# Patient Record
Sex: Male | Born: 1990
Health system: Southern US, Community
[De-identification: ages and names within clinical notes are randomized; demographics above are authoritative.]

## PROBLEM LIST (undated history)

## (undated) DIAGNOSIS — F4310 Post-traumatic stress disorder, unspecified: Secondary | ICD-10-CM

## (undated) DIAGNOSIS — F329 Major depressive disorder, single episode, unspecified: Secondary | ICD-10-CM

## (undated) DIAGNOSIS — I7300 Raynaud's syndrome without gangrene: Secondary | ICD-10-CM

## (undated) DIAGNOSIS — G4730 Sleep apnea, unspecified: Secondary | ICD-10-CM

## (undated) DIAGNOSIS — J45909 Unspecified asthma, uncomplicated: Secondary | ICD-10-CM

## (undated) DIAGNOSIS — E785 Hyperlipidemia, unspecified: Secondary | ICD-10-CM

## (undated) DIAGNOSIS — F419 Anxiety disorder, unspecified: Secondary | ICD-10-CM

## (undated) DIAGNOSIS — F32A Depression, unspecified: Secondary | ICD-10-CM

## (undated) HISTORY — DX: Hyperlipidemia, unspecified: E78.5

## (undated) HISTORY — DX: Raynaud's syndrome without gangrene: I73.00

---
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
## 2009-04-26 ENCOUNTER — Emergency Department (HOSPITAL_COMMUNITY): Admission: EM | Admit: 2009-04-26 | Discharge: 2009-04-26 | Payer: Self-pay | Admitting: Emergency Medicine

## 2010-05-09 ENCOUNTER — Emergency Department (HOSPITAL_COMMUNITY): Payer: Self-pay

## 2010-05-09 ENCOUNTER — Emergency Department (HOSPITAL_COMMUNITY)
Admission: EM | Admit: 2010-05-09 | Discharge: 2010-05-09 | Disposition: A | Discharge status: Home / Self Care | Payer: Self-pay | Attending: Emergency Medicine | Admitting: Emergency Medicine

## 2010-05-09 DIAGNOSIS — S92109A Unspecified fracture of unspecified talus, initial encounter for closed fracture: Secondary | ICD-10-CM | POA: Insufficient documentation

## 2010-05-09 DIAGNOSIS — X500XXA Overexertion from strenuous movement or load, initial encounter: Secondary | ICD-10-CM | POA: Insufficient documentation

## 2010-07-04 ENCOUNTER — Emergency Department (HOSPITAL_COMMUNITY)
Admission: EM | Admit: 2010-07-04 | Discharge: 2010-07-04 | Discharge status: Against Medical Advice | Payer: Self-pay | Attending: Emergency Medicine | Admitting: Emergency Medicine

## 2010-07-04 DIAGNOSIS — R51 Headache: Secondary | ICD-10-CM | POA: Insufficient documentation

## 2010-07-04 DIAGNOSIS — R112 Nausea with vomiting, unspecified: Secondary | ICD-10-CM | POA: Insufficient documentation

## 2010-09-27 ENCOUNTER — Emergency Department (HOSPITAL_COMMUNITY): Payer: Self-pay

## 2010-09-27 ENCOUNTER — Other Ambulatory Visit: Payer: Self-pay

## 2010-09-27 ENCOUNTER — Encounter: Payer: Self-pay | Admitting: Emergency Medicine

## 2010-09-27 ENCOUNTER — Emergency Department (HOSPITAL_COMMUNITY)
Admission: EM | Admit: 2010-09-27 | Discharge: 2010-09-27 | Disposition: A | Discharge status: Home / Self Care | Payer: Self-pay | Attending: Emergency Medicine | Admitting: Emergency Medicine

## 2010-09-27 DIAGNOSIS — R4182 Altered mental status, unspecified: Secondary | ICD-10-CM | POA: Insufficient documentation

## 2010-09-27 DIAGNOSIS — F17200 Nicotine dependence, unspecified, uncomplicated: Secondary | ICD-10-CM | POA: Insufficient documentation

## 2010-09-27 DIAGNOSIS — J45909 Unspecified asthma, uncomplicated: Secondary | ICD-10-CM | POA: Insufficient documentation

## 2010-09-27 LAB — DIFFERENTIAL
Basophils Absolute: 0 10*3/uL (ref 0.0–0.1)
Basophils Relative: 0 % (ref 0–1)
Lymphocytes Relative: 47 % — ABNORMAL HIGH (ref 12–46)
Monocytes Relative: 9 % (ref 3–12)
Neutrophils Relative %: 40 % — ABNORMAL LOW (ref 43–77)

## 2010-09-27 LAB — BASIC METABOLIC PANEL
BUN: 11 mg/dL (ref 6–23)
GFR calc Af Amer: >60 mL/min (ref >60)
GFR calc non Af Amer: >60 mL/min (ref >60)
Glucose, Bld: 141 mg/dL — ABNORMAL HIGH (ref 70–99)
Sodium: 137 mEq/L (ref 135–145)

## 2010-09-27 LAB — CBC
HCT: 47 % (ref 39.0–52.0)
Hemoglobin: 16.2 g/dL (ref 13.0–17.0)
MCH: 31.7 pg (ref 26.0–34.0)
MCV: 92 fL (ref 78.0–100.0)
Platelets: 238 10*3/uL (ref 150–400)
RBC: 5.11 MIL/uL (ref 4.22–5.81)
RDW: 12.6 % (ref 11.5–15.5)
WBC: 4.7 10*3/uL (ref 4.0–10.5)

## 2010-09-27 LAB — BLOOD GAS, ARTERIAL
Acid-base deficit: 1.1 mmol/L (ref 0.0–2.0)
Bicarbonate: 22 mEq/L (ref 20.0–24.0)
O2 Saturation: 98 %
pCO2 arterial: 30 mmHg — ABNORMAL LOW (ref 35.0–45.0)

## 2010-09-27 LAB — LACTIC ACID, PLASMA: Lactic Acid, Venous: 3.6 mmol/L — ABNORMAL HIGH (ref 0.5–2.2)

## 2010-09-27 MED ORDER — HYDROMORPHONE HCL 1 MG/ML IJ SOLN
1.0000 mg | Freq: Once | INTRAMUSCULAR | Status: AC
  Administered 2010-09-27: 1 mg via INTRAVENOUS
  Filled 2010-09-27: qty 1

## 2010-09-27 MED ORDER — IPRATROPIUM BROMIDE 0.02 % IN SOLN: 0.5000 mg | Freq: Once | RESPIRATORY_TRACT | Status: DC

## 2010-09-27 MED ORDER — LORAZEPAM 2 MG/ML IJ SOLN
1.0000 mg | Freq: Once | INTRAMUSCULAR | Status: AC
  Administered 2010-09-27: 1 mg via INTRAVENOUS
  Filled 2010-09-27: qty 1

## 2010-09-27 MED ORDER — SODIUM CHLORIDE 0.9 % IV SOLN
Freq: Once | INTRAVENOUS | Status: AC
  Administered 2010-09-27: 18:00:00 via INTRAVENOUS

## 2010-09-27 MED ORDER — IPRATROPIUM BROMIDE HFA 17 MCG/ACT IN AERS: 2.0000 | INHALATION_SPRAY | Freq: Once | RESPIRATORY_TRACT | Status: DC

## 2010-09-27 MED ORDER — IOHEXOL 350 MG/ML SOLN
100.0000 mL | Freq: Once | INTRAVENOUS | Status: AC | PRN
  Administered 2010-09-27: 100 mL via INTRAVENOUS

## 2010-09-27 MED ORDER — IPRATROPIUM BROMIDE 0.02 % IN SOLN
0.5000 mg | Freq: Once | RESPIRATORY_TRACT | Status: AC
  Administered 2010-09-27: 0.5 mg via RESPIRATORY_TRACT
  Filled 2010-09-27: qty 2.5

## 2010-09-27 MED ORDER — ALBUTEROL SULFATE HFA 108 (90 BASE) MCG/ACT IN AERS: 2.0000 | INHALATION_SPRAY | RESPIRATORY_TRACT | Status: DC | PRN

## 2010-09-27 MED ORDER — METHYLPREDNISOLONE SODIUM SUCC 125 MG IJ SOLR
125.0000 mg | Freq: Once | INTRAMUSCULAR | Status: AC
  Administered 2010-09-27: 125 mg via INTRAVENOUS
  Filled 2010-09-27: qty 2

## 2010-09-27 MED ORDER — ALBUTEROL SULFATE (5 MG/ML) 0.5% IN NEBU
2.5000 mg | INHALATION_SOLUTION | Freq: Once | RESPIRATORY_TRACT | Status: AC
  Administered 2010-09-27: 2.5 mg via RESPIRATORY_TRACT
  Filled 2010-09-27: qty 0.5

## 2010-09-27 MED ORDER — SODIUM CHLORIDE 0.9 % IV BOLUS (SEPSIS)
1000.0000 mL | Freq: Once | INTRAVENOUS | Status: AC
  Administered 2010-09-27: 1000 mL via INTRAVENOUS

## 2010-09-27 MED ORDER — NALOXONE HCL 0.4 MG/ML IJ SOLN
0.4000 mg | INTRAMUSCULAR | Status: DC | PRN
  Administered 2010-09-27: 0.4 mg via INTRAVENOUS

## 2010-09-27 MED ORDER — PREDNISONE 50 MG PO TABS: 50.0000 mg | ORAL_TABLET | Freq: Every day | ORAL | Status: AC

## 2010-09-27 MED ORDER — NALOXONE HCL 0.4 MG/ML IJ SOLN
INTRAMUSCULAR | Status: AC
  Filled 2010-09-27: qty 1

## 2010-09-27 MED ORDER — MAGNESIUM SULFATE 40 MG/ML IJ SOLN
2.0000 g | Freq: Once | INTRAMUSCULAR | Status: AC
  Administered 2010-09-27: 2 g via INTRAVENOUS
  Filled 2010-09-27: qty 50

## 2010-09-27 NOTE — ED Notes (Signed)
Patient having moments of apnea. Will respond and inhale when sternal rub and name called.  3 episodes in in last 15 minute.

## 2010-09-27 NOTE — ED Notes (Signed)
Patient had another episode of apnea. Required sternal rub to get a response.  Patient inhaled deeply and seems alert after.

## 2010-09-27 NOTE — ED Notes (Signed)
Pt self ambulated out with a steady gait ststing no needs

## 2010-09-27 NOTE — ED Notes (Signed)
Dr. Manus Gunning made aware of apneic episode of approximately 15 seconds. Order for narcan received and given. Patient is talking, and is oriented. Remains drowsy.

## 2010-09-27 NOTE — ED Notes (Signed)
Patient arrives via private car. Per girlfriend patient started complaining of pain 20 minutes ago. Patient was unable to tell her where he was hurting. Patient arrives to room 5, responds to painful stimuli. Non verbal at this time, but will grunt and moan. Will point to chest and LUQ of abdomen when asked where he hurts.   Dr. Manus Gunning to bedside to evaluate patient immediately upon arrival to ED room.  Bilateral 18 gauge IVs started by this RN.  Patient placed on continuous cardiac monitoring, continuous pulse oximetry, and NBP cycling q 15 minutes.

## 2010-09-27 NOTE — ED Notes (Signed)
Patient states that it is getting harder to breath

## 2010-09-27 NOTE — ED Notes (Signed)
Patient much more alert.  Speaking to girlfriend and having conversation with MD.  Patient states he is feeling like will vomit.  Patient is sitting upright.

## 2010-09-27 NOTE — ED Notes (Signed)
Patient breathing is less labored.  Patient more alert and responding.

## 2010-09-27 NOTE — ED Notes (Signed)
Family at bedside. Patient remains anxious.   Dr. Manus Gunning returned to bedside to re-evaluate patient.

## 2010-09-27 NOTE — ED Notes (Signed)
Respiratory at bedside.

## 2010-09-27 NOTE — ED Notes (Signed)
Pt resting in bed in room family at bedside no noted distress no stated needs at this time pt given sips of water.

## 2010-09-27 NOTE — ED Notes (Signed)
Pt in room in bed no noted or stated needs

## 2010-09-27 NOTE — ED Provider Notes (Signed)
Scribed for Dr. Manus Gunning, the patient was seen in room 5. This chart was scribed by Hillery Hunter. This patient's care was started at 16:45, upon arrival.    History     CSN: [id number] Arrival date & time: 09/27/2010  4:41 PM  Chief Complaint  Patient presents with  . Altered Mental Status   Patient is a 20 y.o. male presenting with altered mental status. The history is provided by a friend (Girlfriend). The history is limited by the condition of the patient (Altered mental status).  Altered Mental Status This is a new problem. The current episode started less than 1 hour ago (10-15 minutes ago). The problem has not changed since onset.Associated symptoms include chest pain and shortness of breath.    Patient was brought in by ambulance mentally altered and accompanied by his girlfriend. Girlfriend states that he started grabbing his chest at about 10-20 minutes before arrival. He is responds to questions by nodding yes or no only. Girlfriend reports history of exertional chest pain aggravated by running or basketball. Complete collection of history hindered by patient's altered mental status.  History reviewed. No pertinent past medical history. Patient confirms history of asthma without previous hospitalization.  History reviewed. No pertinent past surgical history.  History reviewed. No pertinent family history.  History  Substance Use Topics  . Smoking status: Current Everyday Smoker -- 0.5 packs/day  . Smokeless tobacco: Not on file  . Alcohol Use: Yes     occasionally     Review of Systems  Unable to perform ROS Respiratory: Positive for shortness of breath.   Cardiovascular: Positive for chest pain.  Psychiatric/Behavioral: Positive for altered mental status.  girlfriend present denies vomiting,   Physical Exam  There were no vitals taken for this visit.  16:45 Vitals taken at bedside upon arrival: Pulse ox 100% on room air with BP: 126/88, Pulse:  105  Physical Exam  Nursing note and vitals reviewed. Constitutional: He appears well-developed and well-nourished.  HENT:  Head: Normocephalic and atraumatic.  Mouth/Throat: Oropharynx is clear and moist.  Eyes: Conjunctivae are normal. Pupils are equal, round, and reactive to light.  Neck: Neck supple. No tracheal deviation present.  Cardiovascular: Regular rhythm, normal heart sounds and intact distal pulses.  Tachycardia present.          Tachycardic. Patient localized pain to this region  Pulmonary/Chest: He exhibits tenderness. He exhibits no laceration, no crepitus and no deformity.       equal and bilateral breath sounds  Abdominal: Soft. He exhibits no distension. There is no tenderness.  Musculoskeletal: He exhibits no edema.  Neurological:       Responds to questions by nodding or shaking head only  Skin: He is diaphoretic.  Psychiatric:       NA    ED Course  Procedures  OTHER DATA REVIEWED: Nursing notes, vital signs reviewed   DIAGNOSTIC STUDIES: Oxygen Saturation is 100% on room air, Normal by my interpretation.     Date: 09/27/2010  Rate: 102  Rhythm: sinus tachycardia  QRS Axis: normal  Intervals: normal  ST/T Wave abnormalities: normal  Conduction Disutrbances:none  Narrative Interpretation: No STEMI  Old EKG Reviewed: none available   LABS / RADIOLOGY:  Results for orders placed during the hospital encounter of 09/27/10  CBC      Component Value Range   WBC 4.7  4.0 - 10.5 (K/uL)   RBC 5.11  4.22 - 5.81 (MIL/uL)   Hemoglobin 16.2  13.0 - 17.0 (g/dL)  HCT 47.0  39.0 - 52.0 (%)   MCV 92.0  78.0 - 100.0 (fL)   MCH 31.7  26.0 - 34.0 (pg)   MCHC 34.5  30.0 - 36.0 (g/dL)   RDW 16.1  09.6 - 04.5 (%)   Platelets 238  150 - 400 (K/uL)  DIFFERENTIAL      Component Value Range   Neutrophils Relative 40 (*) 43 - 77 (%)   Neutro Abs 1.9  1.7 - 7.7 (K/uL)   Lymphocytes Relative 47 (*) 12 - 46 (%)   Lymphs Abs 2.2  0.7 - 4.0 (K/uL)   Monocytes  Relative 9  3 - 12 (%)   Monocytes Absolute 0.4  0.1 - 1.0 (K/uL)   Eosinophils Relative 4  0 - 5 (%)   Eosinophils Absolute 0.2  0.0 - 0.7 (K/uL)   Basophils Relative 0  0 - 1 (%)   Basophils Absolute 0.0  0.0 - 0.1 (K/uL)   Dg Chest Portable 1 View  09/27/2010  *RADIOLOGY REPORT*  Clinical Data: Shortness of breath with altered mental status  PORTABLE CHEST - 1 VIEW  Comparison: 04/26/2009  Findings: Heart and mediastinal contours are within normal limits. Lung fields appear clear with no signs of focal infiltrate or congestive failure.  No pleural fluid or peribronchial cuffing is seen.  Bony structures appear intact.  IMPRESSION: Stable cardiopulmonary appearance with no new focal or acute abnormality identified.  Original Report Authenticated By: Bertha Stakes, M.D.    ED COURSE / COORDINATION OF CARE: 16:48. Ordered CBC, BMP, ABG, INR, D-dimer, lipase, lactic acid, differential, portable CXR, EKG. 17:00. Ordered Albuterol/Atrovent nebulizer, IV NS, Ativan 1mg  IVP, Dilaudid 1mg  IVP. 17:12. Rechecked patient who now has wheezing, no crepitus, non febrile with equal and bilateral lung sounds. Patient is still diaphoretic, but nods head to confirm that he feel improved after initial treatment. 17:30. Ordered repeat Albuterol/Atrovent breathing treatment, Prednisone 125mg  IV, magnesium sulfate 2grams IVPB. 18:30. Rechecked patient who is more alert and responsive and feels improved after another breathing treatment. He complains of continuing chest pain. Blood pressure is 133/76 now and he is still tachy at 106. Narcan 0.4mg  IVP administered with me at bedside. 18:35. Rechecked patient who is further improved and happy to have his girlfriend at his side. He improved some with Narcan, but appears to be even more positively affected by having his girlfriend with him. Informed patient of need for additional testing CT Brain, CT Angio chest.   MDM: Differential Diagnosis:  AMS x 10-15 minutes  per girlfriend. Very diaphoretic. Opens eyes to verbal stimuli and nods yes and no. Holds L chest when asked about pain. Grunting and moaning, short shallow breaths. BS equal. L chest wall tender.  Abdomen soft. CBG 139, EKG sinus tachy 102.   Working of breathing and mental status much improved after nebs and steroids.  Chest pain continues.  CTPE neg.  Respiratory alkalosis on ABG consistent with previous hyperventilation.   IMPRESSION: Diagnoses that have been ruled out:  Diagnoses that are still under consideration:  Final diagnoses:      PLAN:  Home Advised to return for worsening or additional problems such as abdominal or chest pain The patient is to return the emergency department if there is any worsening of symptoms. I have reviewed the discharge instructions with the patietn   CONDITION ON DISCHARGE: Stable   MEDICATIONS GIVEN IN THE E.D.  Medications  0.9 %  sodium chloride infusion (not administered)  sodium chloride 0.9 %  bolus 1,000 mL (1000 mL Intravenous Given 09/27/10 1702)  magnesium sulfate IVPB 2 g (2 g Intravenous Given 09/27/10 1729)  ipratropium (ATROVENT) 0.02 % nebulizer solution 0.5 mg (not administered)  LORazepam (ATIVAN) injection 1 mg (1 mg Intravenous Given 09/27/10 1700)  HYDROmorphone (DILAUDID) injection 1 mg (1 mg Intravenous Given 09/27/10 1701)  albuterol (PROVENTIL) (5 MG/ML) 0.5% nebulizer solution 2.5 mg (2.5 mg Nebulization Given 09/27/10 1731)  methylPREDNISolone sodium succinate (SOLU-MEDROL) injection 125 mg (125 mg Intravenous Given 09/27/10 1726)  ipratropium (ATROVENT) 0.02 % nebulizer solution 0.5 mg (0.5 mg Nebulization Given 09/27/10 1731)     DISCHARGE MEDICATIONS: New Prescriptions   No medications on file    I personally performed the services described in this documentation, which was scribed in my presence.  The recorded information has been reviewed and considered.         Glynn Octave, MD 09/28/10 [phone]

## 2010-09-27 NOTE — ED Notes (Signed)
Blood glucose 139

## 2010-09-29 LAB — GLUCOSE, CAPILLARY: Glucose-Capillary: 139 mg/dL — ABNORMAL HIGH (ref 70–99)

## 2012-08-05 ENCOUNTER — Emergency Department (HOSPITAL_COMMUNITY): Payer: Self-pay

## 2012-08-05 ENCOUNTER — Encounter (HOSPITAL_COMMUNITY): Payer: Self-pay | Admitting: Emergency Medicine

## 2012-08-05 ENCOUNTER — Emergency Department (HOSPITAL_COMMUNITY)
Admission: EM | Admit: 2012-08-05 | Discharge: 2012-08-05 | Disposition: A | Discharge status: Home / Self Care | Payer: Self-pay | Attending: Emergency Medicine | Admitting: Emergency Medicine

## 2012-08-05 DIAGNOSIS — W2209XA Striking against other stationary object, initial encounter: Secondary | ICD-10-CM | POA: Insufficient documentation

## 2012-08-05 DIAGNOSIS — S139XXA Sprain of joints and ligaments of unspecified parts of neck, initial encounter: Secondary | ICD-10-CM | POA: Insufficient documentation

## 2012-08-05 DIAGNOSIS — S060X0A Concussion without loss of consciousness, initial encounter: Secondary | ICD-10-CM | POA: Insufficient documentation

## 2012-08-05 DIAGNOSIS — S161XXA Strain of muscle, fascia and tendon at neck level, initial encounter: Secondary | ICD-10-CM

## 2012-08-05 DIAGNOSIS — R4182 Altered mental status, unspecified: Secondary | ICD-10-CM | POA: Insufficient documentation

## 2012-08-05 DIAGNOSIS — F29 Unspecified psychosis not due to a substance or known physiological condition: Secondary | ICD-10-CM | POA: Insufficient documentation

## 2012-08-05 DIAGNOSIS — IMO0002 Reserved for concepts with insufficient information to code with codable children: Secondary | ICD-10-CM | POA: Insufficient documentation

## 2012-08-05 DIAGNOSIS — R42 Dizziness and giddiness: Secondary | ICD-10-CM | POA: Insufficient documentation

## 2012-08-05 DIAGNOSIS — F17200 Nicotine dependence, unspecified, uncomplicated: Secondary | ICD-10-CM | POA: Insufficient documentation

## 2012-08-05 DIAGNOSIS — R413 Other amnesia: Secondary | ICD-10-CM | POA: Insufficient documentation

## 2012-08-05 DIAGNOSIS — Y9389 Activity, other specified: Secondary | ICD-10-CM | POA: Insufficient documentation

## 2012-08-05 DIAGNOSIS — Y9289 Other specified places as the place of occurrence of the external cause: Secondary | ICD-10-CM | POA: Insufficient documentation

## 2012-08-05 LAB — BASIC METABOLIC PANEL
BUN: 13 mg/dL (ref 6–23)
Creatinine, Ser: 1.11 mg/dL (ref 0.50–1.35)
Glucose, Bld: 84 mg/dL (ref 70–99)
Potassium: 4.3 mEq/L (ref 3.5–5.1)
Sodium: 139 mEq/L (ref 135–145)

## 2012-08-05 LAB — ETHANOL: Alcohol, Ethyl (B): <11 mg/dL (ref 0–11)

## 2012-08-05 LAB — CBC
Hemoglobin: 16.5 g/dL (ref 13.0–17.0)
MCH: 30.7 pg (ref 26.0–34.0)
MCHC: 34.4 g/dL (ref 30.0–36.0)
Platelets: 237 10*3/uL (ref 150–400)
RDW: 13.1 % (ref 11.5–15.5)

## 2012-08-05 LAB — RAPID URINE DRUG SCREEN, HOSP PERFORMED
Amphetamines: NOT DETECTED
Barbiturates: NOT DETECTED
Benzodiazepines: NOT DETECTED

## 2012-08-05 LAB — PROTIME-INR
INR: 1.16 (ref 0.00–1.49)
Prothrombin Time: 14.6 s (ref 11.6–15.2)

## 2012-08-05 MED ORDER — METHOCARBAMOL 500 MG PO TABS: 500.0000 mg | ORAL_TABLET | Freq: Two times a day (BID) | ORAL | Status: DC

## 2012-08-05 MED ORDER — METHOCARBAMOL 100 MG/ML IJ SOLN
500.0000 mg | Freq: Once | INTRAVENOUS | Status: AC
  Filled 2012-08-05: qty 5

## 2012-08-05 MED ORDER — TRAMADOL HCL 50 MG PO TABS: 50.0000 mg | ORAL_TABLET | Freq: Four times a day (QID) | ORAL | Status: DC | PRN

## 2012-08-05 MED ORDER — IBUPROFEN 600 MG PO TABS: 600.0000 mg | ORAL_TABLET | Freq: Four times a day (QID) | ORAL | Status: DC | PRN

## 2012-08-05 MED ORDER — METHOCARBAMOL 100 MG/ML IJ SOLN: 1000.0000 mg | Freq: Once | INTRAMUSCULAR | Status: DC

## 2012-08-05 MED ORDER — SODIUM CHLORIDE 0.9 % IV BOLUS (SEPSIS)
1000.0000 mL | Freq: Once | INTRAVENOUS | Status: AC
  Administered 2012-08-05: 1000 mL via INTRAVENOUS

## 2012-08-05 MED ORDER — METHOCARBAMOL 100 MG/ML IJ SOLN
500.0000 mg | Freq: Once | INTRAMUSCULAR | Status: DC
  Filled 2012-08-05 (×2): qty 5

## 2012-08-05 MED ORDER — KETOROLAC TROMETHAMINE 30 MG/ML IJ SOLN
30.0000 mg | Freq: Once | INTRAMUSCULAR | Status: AC
  Administered 2012-08-05: 30 mg via INTRAVENOUS
  Filled 2012-08-05: qty 1

## 2012-08-05 MED ORDER — MORPHINE SULFATE 4 MG/ML IJ SOLN
4.0000 mg | Freq: Once | INTRAMUSCULAR | Status: AC
  Administered 2012-08-05: 4 mg via INTRAVENOUS
  Filled 2012-08-05: qty 1

## 2012-08-05 MED ORDER — METHOCARBAMOL 100 MG/ML IJ SOLN
500.0000 mg | Freq: Once | INTRAMUSCULAR | Status: DC
  Administered 2012-08-05: 500 mg via INTRAVENOUS

## 2012-08-05 NOTE — ED Notes (Signed)
Pt ambulating independently w/ steady gait on d/c in no acute distress, A&Ox4.D/c instructions reviewed w/ pt and family - pt and family deny any further questions or concerns at present.  

## 2012-08-05 NOTE — ED Provider Notes (Signed)
History     CSN: [id number]  Arrival date & time 08/05/12  1659   First MD Initiated Contact with Patient 08/05/12 1704      Chief Complaint  Patient presents with  . Head Injury    (Consider location/radiation/quality/duration/timing/severity/associated sxs/prior treatment) HPI 22 year old M struck his head on a waterslide today at roughly 1600. Pt has had confusion, amnesia to event. AMS per friend who is at bedside. Pt c/o HA and low back pain. Has had increasing drowsiness. Placed in c-collar and on back board by EMS. No known medical hx.  History reviewed. No pertinent past medical history.  No past surgical history on file.  No family history on file.  History  Substance Use Topics  . Smoking status: Current Every Day Smoker -- 0.50 packs/day  . Smokeless tobacco: Not on file  . Alcohol Use: Yes     Comment: occasionally      Review of Systems  Cardiovascular: Negative for chest pain.  Gastrointestinal: Negative for nausea, vomiting and abdominal pain.  Musculoskeletal: Positive for back pain.  Skin: Positive for wound.  Neurological: Positive for light-headedness and headaches.  Psychiatric/Behavioral: Positive for confusion.    Allergies  Review of patient's allergies indicates no known allergies.  Home Medications   Current Outpatient Rx  Name  Route  Sig  Dispense  Refill  . ibuprofen (ADVIL,MOTRIN) 600 MG tablet   Oral   Take 1 tablet (600 mg total) by mouth every 6 (six) hours as needed for pain.   30 tablet   0   . methocarbamol (ROBAXIN) 500 MG tablet   Oral   Take 1 tablet (500 mg total) by mouth 2 (two) times daily.   20 tablet   0   . traMADol (ULTRAM) 50 MG tablet   Oral   Take 1 tablet (50 mg total) by mouth every 6 (six) hours as needed for pain.   15 tablet   0     BP 139/84  Pulse 70  Temp(Src) 98.4 F (36.9 C) (Oral)  Resp 14  SpO2 100%  Physical Exam  Nursing note and vitals reviewed. Constitutional: He is oriented  to person, place, and time. He appears well-developed and well-nourished.  HENT:  Head: Normocephalic and atraumatic.  Mouth/Throat: Oropharynx is clear and moist.  Eyes: EOM are normal. Pupils are equal, round, and reactive to light.  2 mm and sluggish  Neck:  C-collar in place  Cardiovascular: Normal rate and regular rhythm.   Pulmonary/Chest: Effort normal and breath sounds normal. No respiratory distress. He has no wheezes. He has no rales. He exhibits no tenderness.  Abdominal: Soft. Bowel sounds are normal. He exhibits no distension and no mass. There is no tenderness. There is no rebound and no guarding.  Musculoskeletal: Normal range of motion. He exhibits tenderness (TTP midline upper lumbar region). He exhibits no edema.  Neurological: He is oriented to person, place, and time.  Drowsy but arousible.  Follows commands. 5/5 motor in all ext, sensation grossly intact. Pt is not oriented to place. Pt has amnesia of events of today.   Skin: Skin is warm and dry. No rash noted. No erythema.    ED Course  Procedures (including critical care time)  Labs Reviewed  URINE RAPID DRUG SCREEN (HOSP PERFORMED) - Abnormal; Notable for the following:    Opiates POSITIVE (*)    Tetrahydrocannabinol POSITIVE (*)    All other components within normal limits  CBC  BASIC METABOLIC PANEL  ETHANOL  PROTIME-INR  APTT   Dg Lumbar Spine 2-3 Views  08/05/2012   *RADIOLOGY REPORT*  Clinical Data: Head injury.  Fall.  Low back pain.  LUMBAR SPINE - 2-3 VIEW  Comparison: 04/26/2009  Findings: There are five lumbar-type vertebral bodies.  No fracture or malalignment.  Disc spaces well maintained.  SI joints are symmetric.  IMPRESSION: Negative.   Original Report Authenticated By: Charlett Nose, M.D.   Ct Head Wo Contrast  08/05/2012   *RADIOLOGY REPORT*  Clinical Data:  Hit in head.  Loss of memory.  Neck pain.  CT HEAD WITHOUT CONTRAST CT CERVICAL SPINE WITHOUT CONTRAST  Technique:  Multidetector CT  imaging of the head and cervical spine was performed following the standard protocol without intravenous contrast.  Multiplanar CT image reconstructions of the cervical spine were also generated.  Comparison:  09/27/2010  CT HEAD  Findings: No acute intracranial abnormality.  Specifically, no hemorrhage, hydrocephalus, mass lesion, acute infarction, or significant intracranial injury.  No acute calvarial abnormality. Mild mucosal thickening in the ethmoid air cells.  No air fluid levels.  Mastoids are clear.  IMPRESSION: No intracranial abnormality.  CT CERVICAL SPINE  Findings: Normal alignment.  Prevertebral soft tissues are normal. Disc spaces are maintained.  No fracture or subluxation.  No epidural or paraspinal hematoma.  IMPRESSION: Negative.   Original Report Authenticated By: Charlett Nose, M.D.   Ct Cervical Spine Wo Contrast  08/05/2012   *RADIOLOGY REPORT*  Clinical Data:  Hit in head.  Loss of memory.  Neck pain.  CT HEAD WITHOUT CONTRAST CT CERVICAL SPINE WITHOUT CONTRAST  Technique:  Multidetector CT imaging of the head and cervical spine was performed following the standard protocol without intravenous contrast.  Multiplanar CT image reconstructions of the cervical spine were also generated.  Comparison:  09/27/2010  CT HEAD  Findings: No acute intracranial abnormality.  Specifically, no hemorrhage, hydrocephalus, mass lesion, acute infarction, or significant intracranial injury.  No acute calvarial abnormality. Mild mucosal thickening in the ethmoid air cells.  No air fluid levels.  Mastoids are clear.  IMPRESSION: No intracranial abnormality.  CT CERVICAL SPINE  Findings: Normal alignment.  Prevertebral soft tissues are normal. Disc spaces are maintained.  No fracture or subluxation.  No epidural or paraspinal hematoma.  IMPRESSION: Negative.   Original Report Authenticated By: Charlett Nose, M.D.     1. Concussion, without loss of consciousness, initial encounter   2. Cervical strain, acute,  initial encounter       MDM   Pt states he is feeling much better though is still amnestic to event. Discussed with Dr Amada Jupiter who recommends concussion precautions and outpt f/u with [facility].        Loren Racer, MD 08/05/12 2137

## 2012-08-05 NOTE — ED Notes (Signed)
Pt brought to ED by EMS with the complaint of head injury.As per EMS pt hit his head(back) and had LOC. Pt is on C color and back board on.

## 2014-02-20 DIAGNOSIS — I7300 Raynaud's syndrome without gangrene: Secondary | ICD-10-CM | POA: Insufficient documentation

## 2015-05-03 ENCOUNTER — Emergency Department (HOSPITAL_COMMUNITY)
Admission: EM | Admit: 2015-05-03 | Discharge: 2015-05-03 | Disposition: A | Discharge status: Home / Self Care | Payer: Self-pay | Attending: Emergency Medicine | Admitting: Emergency Medicine

## 2015-05-03 ENCOUNTER — Encounter (HOSPITAL_COMMUNITY): Payer: Self-pay | Admitting: Family Medicine

## 2015-05-03 ENCOUNTER — Other Ambulatory Visit: Payer: Self-pay

## 2015-05-03 ENCOUNTER — Emergency Department (HOSPITAL_COMMUNITY): Payer: Self-pay

## 2015-05-03 DIAGNOSIS — R059 Cough, unspecified: Secondary | ICD-10-CM

## 2015-05-03 DIAGNOSIS — J40 Bronchitis, not specified as acute or chronic: Secondary | ICD-10-CM

## 2015-05-03 DIAGNOSIS — Z79899 Other long term (current) drug therapy: Secondary | ICD-10-CM | POA: Insufficient documentation

## 2015-05-03 DIAGNOSIS — R05 Cough: Secondary | ICD-10-CM

## 2015-05-03 DIAGNOSIS — J209 Acute bronchitis, unspecified: Secondary | ICD-10-CM | POA: Insufficient documentation

## 2015-05-03 DIAGNOSIS — F17210 Nicotine dependence, cigarettes, uncomplicated: Secondary | ICD-10-CM | POA: Insufficient documentation

## 2015-05-03 LAB — COMPREHENSIVE METABOLIC PANEL
ALT: 15 U/L — ABNORMAL LOW (ref 17–63)
ANION GAP: 10 (ref 5–15)
AST: 19 U/L (ref 15–41)
Albumin: 3.9 g/dL (ref 3.5–5.0)
Alkaline Phosphatase: 89 U/L (ref 38–126)
BUN: 5 mg/dL — ABNORMAL LOW (ref 6–20)
CALCIUM: 9.3 mg/dL (ref 8.9–10.3)
CHLORIDE: 106 mmol/L (ref 101–111)
CO2: 27 mmol/L (ref 22–32)
Creatinine, Ser: 0.98 mg/dL (ref 0.61–1.24)
Glucose, Bld: 104 mg/dL — ABNORMAL HIGH (ref 65–99)
Potassium: 4.5 mmol/L (ref 3.5–5.1)
SODIUM: 143 mmol/L (ref 135–145)
Total Bilirubin: 0.9 mg/dL (ref 0.3–1.2)
Total Protein: 6.7 g/dL (ref 6.5–8.1)

## 2015-05-03 LAB — URINALYSIS, ROUTINE W REFLEX MICROSCOPIC
Glucose, UA: NEGATIVE mg/dL
HGB URINE DIPSTICK: NEGATIVE
Ketones, ur: NEGATIVE mg/dL
Nitrite: NEGATIVE
PROTEIN: NEGATIVE mg/dL
SPECIFIC GRAVITY, URINE: 1.026 (ref 1.005–1.030)
pH: 8 (ref 5.0–8.0)

## 2015-05-03 LAB — CBC
HCT: 45.8 % (ref 39.0–52.0)
HEMOGLOBIN: 15.1 g/dL (ref 13.0–17.0)
MCH: 30.6 pg (ref 26.0–34.0)
MCHC: 33 g/dL (ref 30.0–36.0)
MCV: 92.7 fL (ref 78.0–100.0)
PLATELETS: 243 10*3/uL (ref 150–400)
RBC: 4.94 MIL/uL (ref 4.22–5.81)
RDW: 13.3 % (ref 11.5–15.5)
WBC: 7.4 10*3/uL (ref 4.0–10.5)

## 2015-05-03 LAB — I-STAT TROPONIN, ED: Troponin i, poc: 0 ng/mL (ref 0.00–0.08)

## 2015-05-03 LAB — LIPASE, BLOOD: LIPASE: 18 U/L (ref 11–51)

## 2015-05-03 LAB — URINE MICROSCOPIC-ADD ON: RBC / HPF: NONE SEEN RBC/hpf (ref 0–5)

## 2015-05-03 MED ORDER — AZITHROMYCIN 250 MG PO TABS: ORAL_TABLET | ORAL | Status: DC

## 2015-05-03 MED ORDER — ALBUTEROL SULFATE HFA 108 (90 BASE) MCG/ACT IN AERS: 1.0000 | INHALATION_SPRAY | Freq: Four times a day (QID) | RESPIRATORY_TRACT | Status: DC | PRN

## 2015-05-03 MED ORDER — IPRATROPIUM-ALBUTEROL 0.5-2.5 (3) MG/3ML IN SOLN
3.0000 mL | Freq: Once | RESPIRATORY_TRACT | Status: AC
  Administered 2015-05-03: 3 mL via RESPIRATORY_TRACT
  Filled 2015-05-03: qty 3

## 2015-05-03 NOTE — ED Notes (Signed)
Pt here for cough, sore throat and sts has been around sick people. sts also N,V,D and fever.

## 2015-05-03 NOTE — Discharge Instructions (Signed)
Please read and follow all provided instructions.  Your diagnoses today include:  1. Bronchitis   2. Cough    Tests performed today include:  Vital signs. See below for your results today.   Medications prescribed:   Take any medication as prescribed.   Home care instructions:  Follow any educational materials contained in this packet.  Follow-up instructions: Please follow-up with your primary care provider in the next 48 hours for further evaluation of symptoms and treatment   Return instructions:   Please return to the Emergency Department if you do not get better, if you get worse, or new symptoms OR  - Fever (temperature greater than 101.39F)  - Bleeding that does not stop with holding pressure to the area    -Severe pain (please note that you may be more sore the day after your accident)  - Chest Pain  - Difficulty breathing  - Severe nausea or vomiting  - Inability to tolerate food and liquids  - Passing out  - Skin becoming red around your wounds  - Change in mental status (confusion or lethargy)  - New numbness or weakness     Please return if you have any other emergent concerns.  Additional Information:  Your vital signs today were: BP 126/84 mmHg   Pulse 74   Temp(Src) 98.2 F (36.8 C) (Oral)   Resp 18   Ht 6\' 1"  (1.854 m)   Wt 89.359 kg   BMI 26.00 kg/m2   SpO2 98% If your blood pressure (BP) was elevated above 135/85 this visit, please have this repeated by your doctor within one month. ---------------

## 2015-05-03 NOTE — ED Provider Notes (Signed)
CSN: [id number]     Arrival date & time 05/03/15  1138 History  By signing my name below, I, Linna Darner, attest that this documentation has been prepared under the direction and in the presence of non-physician practitioner, Audry Pili, PA-C. Electronically Signed: Linna Darner, Scribe. 05/03/2015. 3:26 PM.    Chief Complaint  Patient presents with  . Sore Throat  . Cough  . Hemoptysis    The history is provided by the patient. No language interpreter was used.     HPI Comments: Gregory Curtis is a 25 y.o. male with h/o asthma who presents to the Emergency Department complaining of sudden onset, constant, SOB for the last two days. He states that he attempted to come to the hospital last night but could not move due to weakness and body aches. Pt notes that he has been coughing up specks of blood but not a large amount. Pt notes associated sharp, 10/10 chest pain, chest tightness, 9/10 aching myalgias, generalized weakness, sinus/chest congestion, rhinorrhea, mild abdominal pain, sore throat, nausea, vomiting, and diarrhea. He notes that he had diarrhea x12 yesterday. He states that he has been around sick people with cold symptoms lately, including his daughter, niece, and brother. Pt states that he feels like he "just ran 7 miles." He has no h/o heart attacks but his mother had a heart attack at age 30 and his grandmother has had multiple heart attacks. Pt states that his inhaler prescription ran out three weeks ago. Pt is a former smoker and stopped smoking 6 months ago; he smoked around 5-7 cigarettes a day. Pt denies any recent long travel. He denies hematochezia, fever, or any other associated symptoms.  History reviewed. No pertinent past medical history. History reviewed. No pertinent past surgical history. History reviewed. No pertinent family history. Social History  Substance Use Topics  . Smoking status: Current Every Day Smoker -- 0.50 packs/day  . Smokeless tobacco: None   . Alcohol Use: Yes     Comment: occasionally    Review of Systems  A complete 10 system review of systems was obtained and all systems are negative except as noted in the HPI and PMH.    Allergies  Review of patient's allergies indicates no known allergies.  Home Medications   Prior to Admission medications   Medication Sig Start Date End Date Taking? Authorizing Provider  ibuprofen (ADVIL,MOTRIN) 600 MG tablet Take 1 tablet (600 mg total) by mouth every 6 (six) hours as needed for pain. 08/05/12   Loren Racer, MD  methocarbamol (ROBAXIN) 500 MG tablet Take 1 tablet (500 mg total) by mouth 2 (two) times daily. 08/05/12   Loren Racer, MD  traMADol (ULTRAM) 50 MG tablet Take 1 tablet (50 mg total) by mouth every 6 (six) hours as needed for pain. 08/05/12   Loren Racer, MD   BP 126/84 mmHg  Pulse 74  Temp(Src) 98.2 F (36.8 C) (Oral)  Resp 18  Ht  (1.854 m)  Wt 197 lb (89.359 kg)  BMI 26.00 kg/m2  SpO2 98%   Physical Exam  Constitutional: He is oriented to person, place, and time. He appears well-developed and well-nourished. No distress.  HENT:  Head: Normocephalic and atraumatic.  Eyes: EOM are normal. Pupils are equal, round, and reactive to light.  Neck: Normal range of motion. Neck supple. No tracheal deviation present.  Cardiovascular: Normal rate, regular rhythm and normal heart sounds.   Pulmonary/Chest: Effort normal. No respiratory distress. He has wheezes in the right upper  field, the right lower field, the left upper field and the left lower field. He has no rales. He exhibits no tenderness.  Abdominal: Normal appearance and bowel sounds are normal. There is no tenderness. There is no rigidity, no rebound, no guarding, no tenderness at McBurney's point and negative Murphy's sign.  Musculoskeletal: Normal range of motion.  Neurological: He is alert and oriented to person, place, and time.  Skin: Skin is warm and dry.  Psychiatric: He has a normal mood  and affect. His behavior is normal.  Nursing note and vitals reviewed.  ED Course  Procedures (including critical care time)  DIAGNOSTIC STUDIES: Oxygen Saturation is 98% on RA, normal by my interpretation.    COORDINATION OF CARE: 3:26 PM Will administer nebulizer. Discussed treatment plan with pt at bedside and pt agreed to plan.  Labs Review Labs Reviewed  COMPREHENSIVE METABOLIC PANEL - Abnormal; Notable for the following:    Glucose, Bld 104 (*)    BUN 5 (*)    ALT 15 (*)    All other components within normal limits  URINALYSIS, ROUTINE W REFLEX MICROSCOPIC (NOT AT [facility]) - Abnormal; Notable for the following:    Color, Urine AMBER (*)    APPearance HAZY (*)    Bilirubin Urine SMALL (*)    Leukocytes, UA TRACE (*)    All other components within normal limits  URINE MICROSCOPIC-ADD ON - Abnormal; Notable for the following:    Squamous Epithelial / LPF 0-5 (*)    Bacteria, UA RARE (*)    All other components within normal limits  LIPASE, BLOOD  CBC  I-STAT TROPOININ, ED   Imaging Review Dg Chest 2 View  05/03/2015  CLINICAL DATA:  Shortness of breath chest pain hemoptysis for 2 days, history of asthma EXAM: CHEST  2 VIEW COMPARISON:  12/05/2010 FINDINGS: The heart size and vascular pattern are normal. There is no consolidation or effusion. There is stable mild bronchitic change. IMPRESSION: Stable chronic bronchitic change with no acute findings Electronically Signed   By: Esperanza Heir M.D.   On: 05/03/2015 13:20   I have personally reviewed and evaluated these images and lab results as part of my medical decision-making.   EKG Interpretation   Date/Time:  Monday May 03 2015 16:19:04 EDT Ventricular Rate:  84 PR Interval:  138 QRS Duration: 86 QT Interval:  358 QTC Calculation: 423 R Axis:   3 Text Interpretation:  Normal sinus rhythm with sinus arrhythmia Normal ECG  Confirmed by RAY MD, Duwayne Heck ([id number]) on 05/03/2015 4:23:25 PM      MDM  I have reviewed  and evaluated the relevant laboratory values.I have reviewed and evaluated the relevant imaging studies.I personally evaluated and interpreted the relevant EKG.I have reviewed the relevant previous healthcare records.I obtained HPI from historian.  ED Course:  Assessment:  Pt is a 24yM with hx Asthma who presents with shortness of breath last 2 days. Noted hemoptysis. No recent travel. On exam, pt in NAD. Nontoxic/nonseptic appearing. VSS. Afebrile. Lungs bilateral wheezing. Heart RRR. Abdomen nontender soft. Labs lipase neg. Labs unremarkable. Trop neg. EKG unremarkable. CXR showed bronchitic changes. Pt low risk for ACS with no risk factors of DM, HTN, HLD. Does have FH. Given duo neb in ED with improvement of symptoms. Plan is to DC home with follow up to PCP for further management of symptoms. Symptoms due to acute bronchitis and will DC with ABX and refill albuterol. At time of discharge, Patient is in no acute distress. Vital  Signs are stable. Patient is able to ambulate. Patient able to tolerate PO.    Disposition/Plan:  DC home Additional Verbal discharge instructions given and discussed with patient.  Pt Instructed to f/u with PCP in the next 48 hours for evaluation and treatment of symptoms. Return precautions given Pt acknowledges and agrees with plan  Supervising Physician Doug SouSam Jacubowitz, MD  Final diagnoses:  Bronchitis    I personally performed the services described in this documentation, which was scribed in my presence. The recorded information has been reviewed and is accurate.      Audry Piliyler Esperansa Sarabia, PA-C 05/03/15 1711  Doug SouSam Jacubowitz, MD 05/03/15 [phone]

## 2016-03-14 ENCOUNTER — Encounter (HOSPITAL_COMMUNITY): Payer: Self-pay | Admitting: Emergency Medicine

## 2016-03-14 ENCOUNTER — Telehealth (HOSPITAL_COMMUNITY): Payer: Self-pay | Admitting: Emergency Medicine

## 2016-03-14 ENCOUNTER — Ambulatory Visit (HOSPITAL_COMMUNITY)
Admission: EM | Admit: 2016-03-14 | Discharge: 2016-03-14 | Disposition: A | Discharge status: Home / Self Care | Payer: Self-pay | Attending: Family Medicine | Admitting: Family Medicine

## 2016-03-14 DIAGNOSIS — J4541 Moderate persistent asthma with (acute) exacerbation: Secondary | ICD-10-CM

## 2016-03-14 MED ORDER — IPRATROPIUM-ALBUTEROL 0.5-2.5 (3) MG/3ML IN SOLN
3.0000 mL | Freq: Once | RESPIRATORY_TRACT | Status: AC
  Administered 2016-03-14: 3 mL via RESPIRATORY_TRACT

## 2016-03-14 MED ORDER — PREDNISONE 50 MG PO TABS: ORAL_TABLET | ORAL | 0 refills | Status: DC

## 2016-03-14 MED ORDER — ALBUTEROL SULFATE HFA 108 (90 BASE) MCG/ACT IN AERS: 2.0000 | INHALATION_SPRAY | RESPIRATORY_TRACT | 0 refills | Status: DC | PRN

## 2016-03-14 MED ORDER — SODIUM CHLORIDE 0.9 % IN NEBU
INHALATION_SOLUTION | RESPIRATORY_TRACT | Status: AC
  Filled 2016-03-14: qty 3

## 2016-03-14 MED ORDER — PREDNISONE 20 MG PO TABS
ORAL_TABLET | ORAL | Status: AC
  Filled 2016-03-14: qty 2

## 2016-03-14 MED ORDER — ALBUTEROL SULFATE HFA 108 (90 BASE) MCG/ACT IN AERS: 2.0000 | INHALATION_SPRAY | RESPIRATORY_TRACT | 0 refills | Status: AC | PRN

## 2016-03-14 MED ORDER — IPRATROPIUM-ALBUTEROL 0.5-2.5 (3) MG/3ML IN SOLN: 3.0000 mL | Freq: Once | RESPIRATORY_TRACT | Status: DC

## 2016-03-14 MED ORDER — PREDNISONE 20 MG PO TABS: 40.0000 mg | ORAL_TABLET | Freq: Once | ORAL | Status: DC

## 2016-03-14 MED ORDER — PREDNISONE 20 MG PO TABS
40.0000 mg | ORAL_TABLET | Freq: Once | ORAL | Status: AC
  Administered 2016-03-14: 40 mg via ORAL

## 2016-03-14 MED ORDER — IPRATROPIUM-ALBUTEROL 0.5-2.5 (3) MG/3ML IN SOLN
RESPIRATORY_TRACT | Status: AC
  Filled 2016-03-14: qty 3

## 2016-03-14 NOTE — Telephone Encounter (Signed)
Patient changed pharmacy. Resent to new pharmacy.

## 2016-03-14 NOTE — Discharge Instructions (Signed)
Use the albuterol inhaler 2 puffs every 4 hours as needed for wheeze and cough. Start taking the prednisone tablets tomorrow on a daily basis for the next 6 days. Take with food. Follow-up with your primary care doctor as soon as possible.

## 2016-03-14 NOTE — ED Triage Notes (Addendum)
Reports has not had albuterol pump in 2 months.  Requesting albuterol pump refill  Difficulty catching breathing since yesterday.  Congested cough

## 2016-03-14 NOTE — ED Provider Notes (Signed)
CSN: [id number]     Arrival date & time 03/14/16  1000 History   First MD Initiated Contact with Patient 03/14/16 1023     Chief Complaint  Patient presents with  . Asthma   (Consider location/radiation/quality/duration/timing/severity/associated sxs/prior Treatment) 26 year old male with a history of asthma states he has been out of his albuterol HFA for once 2 months. Yesterday he had a flareup during the day and has increased through today. He apparently has had problems with seen his PCP at one of the [facility] and is in the process of having a change of providers. Denies fevers or chills.      History reviewed. No pertinent past medical history. History reviewed. No pertinent surgical history. No family history on file. Social History  Substance Use Topics  . Smoking status: Current Every Day Smoker    Packs/day: 0.50  . Smokeless tobacco: Not on file  . Alcohol use Yes     Comment: occasionally    Review of Systems  Constitutional: Positive for activity change.  HENT: Negative.   Respiratory: Positive for shortness of breath and wheezing.   Cardiovascular: Negative.   Gastrointestinal: Negative.   Skin: Negative.   Psychiatric/Behavioral: Negative.   All other systems reviewed and are negative.   Allergies  Patient has no known allergies.  Home Medications   Prior to Admission medications   Medication Sig Start Date End Date Taking? Authorizing Provider  albuterol (PROVENTIL HFA;VENTOLIN HFA) 108 (90 Base) MCG/ACT inhaler Inhale 2 puffs into the lungs every 4 (four) hours as needed for wheezing or shortness of breath. 03/14/16   Hayden Rasmussenavid Keisuke Hollabaugh, NP  ibuprofen (ADVIL,MOTRIN) 600 MG tablet Take 1 tablet (600 mg total) by mouth every 6 (six) hours as needed for pain. 08/05/12   Loren Raceravid Yelverton, MD  methocarbamol (ROBAXIN) 500 MG tablet Take 1 tablet (500 mg total) by mouth 2 (two) times daily. 08/05/12   Loren Raceravid Yelverton, MD  predniSONE (DELTASONE) 50 MG tablet 1 tab po  daily for 6 days. Take with food. 03/14/16   Hayden Rasmussenavid Anjelina Dung, NP  traMADol (ULTRAM) 50 MG tablet Take 1 tablet (50 mg total) by mouth every 6 (six) hours as needed for pain. 08/05/12   Loren Raceravid Yelverton, MD   Meds Ordered and Administered this Visit   Medications  ipratropium-albuterol (DUONEB) 0.5-2.5 (3) MG/3ML nebulizer solution 3 mL (not administered)  predniSONE (DELTASONE) tablet 40 mg (not administered)    BP 130/87 (BP Location: Left Arm)   Pulse 91   Temp 98.1 F (36.7 C) (Oral)   Resp 16   SpO2 100%  No data found.   Physical Exam  Constitutional: He is oriented to person, place, and time. He appears well-developed and well-nourished. No distress.  Eyes: EOM are normal.  Neck: Normal range of motion. Neck supple.  Cardiovascular: Normal rate, regular rhythm, normal heart sounds and intact distal pulses.   Pulmonary/Chest: Effort normal. No respiratory distress. He has wheezes.  Few inspiratory wheezes. Expiration with wheezing and prolonged expiratory phase. No crackles. Air movement  good  Musculoskeletal: Normal range of motion.  Neurological: He is alert and oriented to person, place, and time.  Skin: Skin is warm and dry.  Nursing note and vitals reviewed.   Urgent Care Course     Procedures (including critical care time)  Labs Review Labs Reviewed - No data to display  Imaging Review No results found.   Visual Acuity Review  Right Eye Distance:   Left Eye Distance:   Bilateral Distance:  Right Eye Near:   Left Eye Near:    Bilateral Near:         MDM   1. Moderate persistent asthma with exacerbation   2. Moderate persistent asthma with acute exacerbation    Use the albuterol inhaler 2 puffs every 4 hours as needed for wheeze and cough. Start taking the prednisone tablets tomorrow on a daily basis for the next 6 days. Take with food. Follow-up with your primary care doctor as soon as possible. Meds ordered this encounter  Medications  .  ipratropium-albuterol (DUONEB) 0.5-2.5 (3) MG/3ML nebulizer solution 3 mL  . predniSONE (DELTASONE) tablet 40 mg  . albuterol (PROVENTIL HFA;VENTOLIN HFA) 108 (90 Base) MCG/ACT inhaler    Sig: Inhale 2 puffs into the lungs every 4 (four) hours as needed for wheezing or shortness of breath.    Dispense:  1 Inhaler    Refill:  0    Order Specific Question:   Supervising Provider    Answer:   Bradd Canary D [id number]  . predniSONE (DELTASONE) 50 MG tablet    Sig: 1 tab po daily for 6 days. Take with food.    Dispense:  6 tablet    Refill:  0    Order Specific Question:   Supervising Provider    Answer:   Linna Hoff [phone]  Post Duoneb pt able to be discharged with meds. Cond stable.    Hayden Rasmussen, NP 03/14/16 1044

## 2016-08-28 ENCOUNTER — Encounter (HOSPITAL_COMMUNITY): Payer: Self-pay

## 2016-08-28 ENCOUNTER — Emergency Department (HOSPITAL_COMMUNITY)
Admission: EM | Admit: 2016-08-28 | Discharge: 2016-08-28 | Disposition: A | Discharge status: Home / Self Care | Payer: Self-pay | Attending: Emergency Medicine | Admitting: Emergency Medicine

## 2016-08-28 DIAGNOSIS — R0602 Shortness of breath: Secondary | ICD-10-CM | POA: Insufficient documentation

## 2016-08-28 DIAGNOSIS — J45909 Unspecified asthma, uncomplicated: Secondary | ICD-10-CM | POA: Insufficient documentation

## 2016-08-28 DIAGNOSIS — Z5321 Procedure and treatment not carried out due to patient leaving prior to being seen by health care provider: Secondary | ICD-10-CM | POA: Insufficient documentation

## 2016-08-28 HISTORY — DX: Unspecified asthma, uncomplicated: J45.909

## 2016-08-28 NOTE — ED Notes (Addendum)
Patient came to desk, asking to have IV removed. He stated that needed to leave. Removed IV, and informed patient to come back, if he felt he needed to. Patient understood, and thank us.

## 2016-08-28 NOTE — ED Triage Notes (Signed)
Per EMS, pt from home with shob, hx of asthma. Inhaler is empty. Pt received 15 albuterol, 1 of atrovent, 125 of solumedrol in route and 2g of magnesium, of fluid. Wheezing and shob decreased after treatment. BP 128/78, HR 110, RR 18, spo2 100% on neb. Able to speak in complete sentences now.

## 2017-02-20 HISTORY — PX: NECK SURGERY: SHX720

## 2017-10-15 DIAGNOSIS — M47812 Spondylosis without myelopathy or radiculopathy, cervical region: Secondary | ICD-10-CM | POA: Insufficient documentation

## 2017-10-26 DIAGNOSIS — M5020 Other cervical disc displacement, unspecified cervical region: Secondary | ICD-10-CM | POA: Insufficient documentation

## 2017-11-23 DIAGNOSIS — Z4889 Encounter for other specified surgical aftercare: Secondary | ICD-10-CM | POA: Insufficient documentation

## 2017-11-23 DIAGNOSIS — Z981 Arthrodesis status: Secondary | ICD-10-CM | POA: Insufficient documentation

## 2018-03-30 ENCOUNTER — Emergency Department (HOSPITAL_COMMUNITY): Payer: Non-veteran care

## 2018-03-30 ENCOUNTER — Emergency Department (HOSPITAL_COMMUNITY)
Admission: EM | Admit: 2018-03-30 | Discharge: 2018-03-30 | Disposition: A | Discharge status: Home / Self Care | Payer: Non-veteran care | Attending: Emergency Medicine | Admitting: Emergency Medicine

## 2018-03-30 ENCOUNTER — Encounter (HOSPITAL_COMMUNITY): Payer: Self-pay

## 2018-03-30 ENCOUNTER — Other Ambulatory Visit: Payer: Self-pay

## 2018-03-30 DIAGNOSIS — R05 Cough: Secondary | ICD-10-CM | POA: Diagnosis not present

## 2018-03-30 DIAGNOSIS — Z87891 Personal history of nicotine dependence: Secondary | ICD-10-CM | POA: Diagnosis not present

## 2018-03-30 DIAGNOSIS — J45909 Unspecified asthma, uncomplicated: Secondary | ICD-10-CM | POA: Diagnosis not present

## 2018-03-30 DIAGNOSIS — R112 Nausea with vomiting, unspecified: Secondary | ICD-10-CM | POA: Diagnosis present

## 2018-03-30 DIAGNOSIS — J111 Influenza due to unidentified influenza virus with other respiratory manifestations: Secondary | ICD-10-CM | POA: Diagnosis not present

## 2018-03-30 LAB — CBC
HEMATOCRIT: 54.5 % — AB (ref 39.0–52.0)
HEMOGLOBIN: 18.2 g/dL — AB (ref 13.0–17.0)
MCH: 30 pg (ref 26.0–34.0)
MCHC: 33.4 g/dL (ref 30.0–36.0)
MCV: 89.8 fL (ref 80.0–100.0)
NRBC: 0 % (ref 0.0–0.2)
Platelets: 178 10*3/uL (ref 150–400)
RBC: 6.07 MIL/uL — ABNORMAL HIGH (ref 4.22–5.81)
RDW: 12.8 % (ref 11.5–15.5)
WBC: 6.8 10*3/uL (ref 4.0–10.5)

## 2018-03-30 LAB — COMPREHENSIVE METABOLIC PANEL
ALBUMIN: 4.5 g/dL (ref 3.5–5.0)
ALK PHOS: 70 U/L (ref 38–126)
ALT: 17 U/L (ref 0–44)
AST: 27 U/L (ref 15–41)
Anion gap: 17 — ABNORMAL HIGH (ref 5–15)
BUN: 15 mg/dL (ref 6–20)
CHLORIDE: 104 mmol/L (ref 98–111)
CO2: 16 mmol/L — AB (ref 22–32)
CREATININE: 1.34 mg/dL — AB (ref 0.61–1.24)
Calcium: 9.7 mg/dL (ref 8.9–10.3)
GFR calc Af Amer: >60 mL/min (ref >60)
GFR calc non Af Amer: >60 mL/min (ref >60)
GLUCOSE: 105 mg/dL — AB (ref 70–99)
Potassium: 3.6 mmol/L (ref 3.5–5.1)
Sodium: 137 mmol/L (ref 135–145)
Total Bilirubin: 1 mg/dL (ref 0.3–1.2)
Total Protein: 8.5 g/dL — ABNORMAL HIGH (ref 6.5–8.1)

## 2018-03-30 LAB — INFLUENZA PANEL BY PCR (TYPE A & B)
INFLBPCR: NEGATIVE
Influenza A By PCR: POSITIVE — AB

## 2018-03-30 LAB — LIPASE, BLOOD: LIPASE: 25 U/L (ref 11–51)

## 2018-03-30 MED ORDER — ONDANSETRON 4 MG PO TBDP: 4.0000 mg | ORAL_TABLET | Freq: Three times a day (TID) | ORAL | 0 refills | Status: DC | PRN

## 2018-03-30 MED ORDER — ONDANSETRON 4 MG PO TBDP
4.0000 mg | ORAL_TABLET | Freq: Once | ORAL | Status: AC | PRN
  Administered 2018-03-30: 4 mg via ORAL
  Filled 2018-03-30: qty 1

## 2018-03-30 MED ORDER — OSELTAMIVIR PHOSPHATE 75 MG PO CAPS
75.0000 mg | ORAL_CAPSULE | Freq: Once | ORAL | Status: AC
  Administered 2018-03-30: 75 mg via ORAL
  Filled 2018-03-30: qty 1

## 2018-03-30 MED ORDER — ALBUTEROL SULFATE (2.5 MG/3ML) 0.083% IN NEBU
5.0000 mg | INHALATION_SOLUTION | Freq: Once | RESPIRATORY_TRACT | Status: AC
  Administered 2018-03-30: 5 mg via RESPIRATORY_TRACT
  Filled 2018-03-30: qty 6

## 2018-03-30 MED ORDER — OSELTAMIVIR PHOSPHATE 75 MG PO CAPS: 75.0000 mg | ORAL_CAPSULE | Freq: Two times a day (BID) | ORAL | 0 refills | Status: DC

## 2018-03-30 MED ORDER — KETOROLAC TROMETHAMINE 30 MG/ML IJ SOLN
15.0000 mg | Freq: Once | INTRAMUSCULAR | Status: AC
  Administered 2018-03-30: 15 mg via INTRAVENOUS
  Filled 2018-03-30: qty 1

## 2018-03-30 MED ORDER — SODIUM CHLORIDE 0.9% FLUSH
3.0000 mL | Freq: Once | INTRAVENOUS | Status: AC
  Administered 2018-03-30: 3 mL via INTRAVENOUS

## 2018-03-30 NOTE — ED Provider Notes (Signed)
[facility] Provider Note   CSN: [id number] Arrival date & time: 03/30/18  1628     History   Chief Complaint Chief Complaint  Patient presents with  . Cough  . Emesis    HPI Gregory Curtis is a [age] y.o. male.  HPI Patient with history of asthma presents with 3 days of flulike illness. Patient notes that he is generally well prior to the onset of illness. During this illness he has had nausea, diarrhea, diffuse discomfort, subjective fever and chills. No relief with OTC medication. Patient does smoke cigarettes and smokes marijuana, though he has used neither in the past 5 days. He notes multiple sick contacts, with other dividual's in his household with similar illness. No focal pain in his abdomen, he does have some a mild sore throat.  Past Medical History:  Diagnosis Date  . Asthma     There are no active problems to display for this patient.   History reviewed. No pertinent surgical history.      Home Medications    Prior to Admission medications   Medication Sig Start Date End Date Taking? Authorizing Provider  albuterol (PROVENTIL HFA;VENTOLIN HFA) 108 (90 Base) MCG/ACT inhaler Inhale 2 puffs into the lungs every 4 (four) hours as needed for wheezing or shortness of breath. Patient not taking: Reported on 08/28/2016 03/14/16   Hayden RasmussenMabe, David, NP  ondansetron (ZOFRAN ODT) 4 MG disintegrating tablet Take 1 tablet (4 mg total) by mouth every 8 (eight) hours as needed for nausea or vomiting. 03/30/18   Gerhard MunchLockwood, Sevyn Markham, MD  oseltamivir (TAMIFLU) 75 MG capsule Take 1 capsule (75 mg total) by mouth every 12 (twelve) hours. 03/30/18   Gerhard MunchLockwood, Emilea Goga, MD    Family History History reviewed. No pertinent family history.  Social History Social History   Tobacco Use  . Smoking status: Former Smoker    Last attempt to quit: 07/29/2016    Years since quitting: 1.6  Substance Use Topics  . Alcohol use: Yes    Comment: occasionally    . Drug use: No     Allergies   Patient has no known allergies.   Review of Systems Review of Systems  Constitutional:       Per HPI, otherwise negative  HENT:       Per HPI, otherwise negative  Respiratory:       Per HPI, otherwise negative  Cardiovascular:       Per HPI, otherwise negative  Gastrointestinal: Positive for diarrhea, nausea and vomiting.  Endocrine:       Negative aside from HPI  Genitourinary:       Neg aside from HPI   Musculoskeletal:       Per HPI, otherwise negative  Skin: Negative.   Neurological: Positive for weakness. Negative for syncope.     Physical Exam Updated Vital Signs BP 138/85   Pulse 80   Temp 98.5 F (36.9 C) (Oral)   Resp 16   SpO2 100%   Physical Exam Vitals signs and nursing note reviewed.  Constitutional:      Appearance: He is well-developed. He is ill-appearing and diaphoretic.     Comments: Uncomfortable appearing adult male awake and alert.  HENT:     Head: Normocephalic and atraumatic.     Mouth/Throat:     Mouth: Mucous membranes are moist.     Pharynx: No oropharyngeal exudate or posterior oropharyngeal erythema.  Eyes:     Conjunctiva/sclera: Conjunctivae normal.  Cardiovascular:  Rate and Rhythm: Regular rhythm. Tachycardia present.  Pulmonary:     Effort: Pulmonary effort is normal. No respiratory distress.     Breath sounds: No stridor.  Abdominal:     General: There is no distension.     Tenderness: There is no abdominal tenderness. There is no guarding.  Skin:    General: Skin is warm.  Neurological:     Mental Status: He is alert and oriented to person, place, and time.      ED Treatments / Results  Labs (all labs ordered are listed, but only abnormal results are displayed) Labs Reviewed  COMPREHENSIVE METABOLIC PANEL - Abnormal; Notable for the following components:      Result Value   CO2 16 (*)    Glucose, Bld 105 (*)    Creatinine, Ser 1.34 (*)    Total Protein 8.5 (*)    Anion  gap 17 (*)    All other components within normal limits  CBC - Abnormal; Notable for the following components:   RBC 6.07 (*)    Hemoglobin 18.2 (*)    HCT 54.5 (*)    All other components within normal limits  INFLUENZA PANEL BY PCR (TYPE A & B) - Abnormal; Notable for the following components:   Influenza A By PCR POSITIVE (*)    All other components within normal limits  LIPASE, BLOOD    EKG EKG Interpretation  Date/Time:  Saturday March 30 2018 16:43:08 EST Ventricular Rate:  100 PR Interval:  122 QRS Duration: 82 QT Interval:  336 QTC Calculation: 433 R Axis:   -50 Text Interpretation:  Normal sinus rhythm Left axis deviation Pulmonary disease pattern Abnormal ECG Confirmed by Gerhard Munch [phone]) on 03/30/2018 6:31:35 PM   Radiology Dg Chest 2 View  Result Date: 03/30/2018 CLINICAL DATA:  Cough and vomiting EXAM: CHEST - 2 VIEW COMPARISON:  Chest radiograph 10/18/2017 FINDINGS: Monitoring leads overlie the patient. Stable cardiac and mediastinal contours. No consolidative pulmonary opacities. No pleural effusion or pneumothorax. IMPRESSION: No acute cardiopulmonary process. Electronically Signed   By: Annia Belt M.D.   On: 03/30/2018 19:15    Procedures Procedures (including critical care time)  Medications Ordered in ED Medications  oseltamivir (TAMIFLU) capsule 75 mg (has no administration in time range)  sodium chloride flush (NS) 0.9 % injection 3 mL (3 mLs Intravenous Given 03/30/18 1815)  ondansetron (ZOFRAN-ODT) disintegrating tablet 4 mg (4 mg Oral Given 03/30/18 1709)  albuterol (PROVENTIL) (2.5 MG/3ML) 0.083% nebulizer solution 5 mg (5 mg Nebulization Given 03/30/18 1814)  ketorolac (TORADOL) 30 MG/ML injection 15 mg (15 mg Intravenous Given 03/30/18 1815)     Initial Impression / Assessment and Plan / ED Course  I have reviewed the triage vital signs and the nursing notes.  Pertinent labs & imaging results that were available during my care of the patient  were reviewed by me and considered in my medical decision making (see chart for details).    With mild tachycardia, concern for flulike illness, as well as consideration of pneumonia given the patient smoking history, the patient had labs, received fluids, Zofran, Toradol. X-ray imaging pending. 9:15 PM Substantially better. X-ray unremarkable. Labs notable for influenza positive result. Patient is improved substantially with fluids, Toradol, Zofran, has no additional vomiting, no distress. Patient has not received albuterol as well. We reviewed all findings including influenza positive test, discussed the importance of monitoring, management, medication at home, and future vaccines. Patient is amenable to all of the above.  With improvement here he was discharged in stable condition.  Final Clinical Impressions(s) / ED Diagnoses   Final diagnoses:  Influenza    ED Discharge Orders         Ordered    oseltamivir (TAMIFLU) 75 MG capsule  Every 12 hours,   Status:  Discontinued     03/30/18 2105    ondansetron (ZOFRAN ODT) 4 MG disintegrating tablet  Every 8 hours PRN,   Status:  Discontinued     03/30/18 2105    ondansetron (ZOFRAN ODT) 4 MG disintegrating tablet  Every 8 hours PRN     03/30/18 2110    oseltamivir (TAMIFLU) 75 MG capsule  Every 12 hours     03/30/18 2110           Gerhard Munch, MD 03/30/18 2115

## 2018-03-30 NOTE — Discharge Instructions (Signed)
Please get plenty of rest, drink plenty of fluids and monitor your condition carefully. Return here for concerning changes in your condition.

## 2018-03-30 NOTE — ED Triage Notes (Signed)
Onset 3 days cough, vomiting x 10 today, nausea, and diarrhea, watery x 10.   Unable to tolerate any po fluids.  Family members in household had cold/cough symptoms that have resolved.

## 2018-03-30 NOTE — ED Notes (Signed)
Patient verbalizes understanding of discharge instructions. Opportunity for questioning and answers were provided. Armband removed by staff, pt discharged from ED ambulatory w/ family  

## 2018-04-03 ENCOUNTER — Encounter (HOSPITAL_COMMUNITY): Payer: Self-pay | Admitting: *Deleted

## 2018-04-03 ENCOUNTER — Emergency Department (HOSPITAL_COMMUNITY)
Admission: EM | Admit: 2018-04-03 | Discharge: 2018-04-03 | Disposition: A | Discharge status: Home / Self Care | Payer: Non-veteran care | Attending: Emergency Medicine | Admitting: Emergency Medicine

## 2018-04-03 ENCOUNTER — Emergency Department (HOSPITAL_COMMUNITY): Payer: Non-veteran care

## 2018-04-03 DIAGNOSIS — J101 Influenza due to other identified influenza virus with other respiratory manifestations: Secondary | ICD-10-CM | POA: Diagnosis not present

## 2018-04-03 DIAGNOSIS — Z87891 Personal history of nicotine dependence: Secondary | ICD-10-CM | POA: Diagnosis not present

## 2018-04-03 DIAGNOSIS — J111 Influenza due to unidentified influenza virus with other respiratory manifestations: Secondary | ICD-10-CM

## 2018-04-03 DIAGNOSIS — J45909 Unspecified asthma, uncomplicated: Secondary | ICD-10-CM | POA: Insufficient documentation

## 2018-04-03 DIAGNOSIS — Z79899 Other long term (current) drug therapy: Secondary | ICD-10-CM | POA: Diagnosis not present

## 2018-04-03 DIAGNOSIS — R05 Cough: Secondary | ICD-10-CM | POA: Diagnosis present

## 2018-04-03 LAB — CBC WITH DIFFERENTIAL/PLATELET
Abs Immature Granulocytes: 0 10*3/uL (ref 0.00–0.07)
Basophils Absolute: 0 10*3/uL (ref 0.0–0.1)
Basophils Relative: 0 %
Eosinophils Absolute: 0 10*3/uL (ref 0.0–0.5)
Eosinophils Relative: 0 %
HCT: 48.7 % (ref 39.0–52.0)
Hemoglobin: 16.1 g/dL (ref 13.0–17.0)
Lymphocytes Relative: 15 %
Lymphs Abs: 1.6 10*3/uL (ref 0.7–4.0)
MCH: 30.2 pg (ref 26.0–34.0)
MCHC: 33.1 g/dL (ref 30.0–36.0)
MCV: 91.4 fL (ref 80.0–100.0)
Monocytes Absolute: 1.1 10*3/uL — ABNORMAL HIGH (ref 0.1–1.0)
Monocytes Relative: 10 %
Neutro Abs: 7.9 10*3/uL — ABNORMAL HIGH (ref 1.7–7.7)
Neutrophils Relative %: 75 %
Platelets: 236 10*3/uL (ref 150–400)
RBC: 5.33 MIL/uL (ref 4.22–5.81)
RDW: 12.3 % (ref 11.5–15.5)
WBC: 10.5 10*3/uL (ref 4.0–10.5)
nRBC: 0 % (ref 0.0–0.2)
nRBC: 0 /100 WBC

## 2018-04-03 LAB — COMPREHENSIVE METABOLIC PANEL
ALT: 18 U/L (ref 0–44)
AST: 22 U/L (ref 15–41)
Albumin: 4.1 g/dL (ref 3.5–5.0)
Alkaline Phosphatase: 49 U/L (ref 38–126)
Anion gap: 15 (ref 5–15)
BUN: 10 mg/dL (ref 6–20)
CO2: 20 mmol/L — ABNORMAL LOW (ref 22–32)
Calcium: 9.3 mg/dL (ref 8.9–10.3)
Chloride: 106 mmol/L (ref 98–111)
Creatinine, Ser: 1.16 mg/dL (ref 0.61–1.24)
GFR calc Af Amer: >60 mL/min (ref >60)
GFR calc non Af Amer: >60 mL/min (ref >60)
Glucose, Bld: 95 mg/dL (ref 70–99)
Potassium: 3.9 mmol/L (ref 3.5–5.1)
Sodium: 141 mmol/L (ref 135–145)
Total Bilirubin: 1.6 mg/dL — ABNORMAL HIGH (ref 0.3–1.2)
Total Protein: 7.5 g/dL (ref 6.5–8.1)

## 2018-04-03 LAB — LIPASE, BLOOD: Lipase: 25 U/L (ref 11–51)

## 2018-04-03 MED ORDER — ONDANSETRON HCL 4 MG/2ML IJ SOLN
4.0000 mg | Freq: Once | INTRAMUSCULAR | Status: AC
  Administered 2018-04-03: 4 mg via INTRAVENOUS
  Filled 2018-04-03: qty 2

## 2018-04-03 MED ORDER — PREDNISONE 50 MG PO TABS: 50.0000 mg | ORAL_TABLET | Freq: Every day | ORAL | 0 refills | Status: DC

## 2018-04-03 MED ORDER — SODIUM CHLORIDE 0.9 % IV BOLUS
1000.0000 mL | Freq: Once | INTRAVENOUS | Status: AC
  Administered 2018-04-03: 1000 mL via INTRAVENOUS

## 2018-04-03 MED ORDER — KETOROLAC TROMETHAMINE 30 MG/ML IJ SOLN
30.0000 mg | Freq: Once | INTRAMUSCULAR | Status: AC
  Administered 2018-04-03: 30 mg via INTRAVENOUS
  Filled 2018-04-03: qty 1

## 2018-04-03 MED ORDER — IPRATROPIUM-ALBUTEROL 0.5-2.5 (3) MG/3ML IN SOLN
3.0000 mL | Freq: Once | RESPIRATORY_TRACT | Status: AC
  Administered 2018-04-03: 3 mL via RESPIRATORY_TRACT
  Filled 2018-04-03: qty 3

## 2018-04-03 MED ORDER — SODIUM CHLORIDE 0.9 % IV BOLUS
500.0000 mL | Freq: Once | INTRAVENOUS | Status: AC
  Administered 2018-04-03: 500 mL via INTRAVENOUS

## 2018-04-03 MED ORDER — ONDANSETRON HCL 4 MG PO TABS: 4.0000 mg | ORAL_TABLET | Freq: Four times a day (QID) | ORAL | 0 refills | Status: DC

## 2018-04-03 MED ORDER — BENZONATATE 100 MG PO CAPS: 100.0000 mg | ORAL_CAPSULE | Freq: Three times a day (TID) | ORAL | 0 refills | Status: DC

## 2018-04-03 MED ORDER — ACETAMINOPHEN 500 MG PO TABS: 500.0000 mg | ORAL_TABLET | Freq: Four times a day (QID) | ORAL | 0 refills | Status: DC | PRN

## 2018-04-03 NOTE — ED Notes (Signed)
Discharge instructions and prescriptions discussed with Pt. Pt verbalized understanding. Pt stable and ambulatory.   

## 2018-04-03 NOTE — ED Notes (Signed)
Patient transported to X-ray 

## 2018-04-03 NOTE — Discharge Instructions (Addendum)
Take prednisone until completed.  Take Tessalon every 8 hours as needed for cough.  Take Zofran every 6 hours as needed for nausea or vomiting.  Take Tylenol every 6 hours as needed for fever and body aches.  Make sure to get plenty of rest and drink plenty of fluids.  Please return to the emergency department if you develop any new or worsening symptoms.  Please follow-up with your primary care provider if your symptoms are persisting past 10 to 14 days.  Please follow-up with the hand doctor, Dr. Mina Marble, in 10 days for repeat x-ray.  Make sure to only take off your brace if you are bathing or washing your hands.  It is important that you keep this on at all times in case of occult scaphoid fracture.

## 2018-04-03 NOTE — ED Notes (Signed)
[employer] notified that pt is threatening to rip out IV and that he does not want white nurses to care for him.

## 2018-04-03 NOTE — ED Notes (Signed)
Pt very rude to staff saying "they forget about a mother-fucker in here" Pt stood up to get in wheelchair and seemed very unsteady when standing but did not like this EMT helping him up.

## 2018-04-03 NOTE — ED Provider Notes (Signed)
[facility] EMERGENCY DEPARTMENT Provider Note   CSN: [id number] Arrival date & time: 04/03/18  1557     History   Chief Complaint Chief Complaint  Patient presents with  . Influenza    HPI Gregory Curtis is a 28 y.o. male with history of asthma and recent diagnosis of influenza A who presents with a one-week history of cough, shortness of breath, body aches, abdominal pain, nausea, vomiting.  Diarrhea has been improving.  He has been taking Tamiflu and Zofran.  He has not been taking ibuprofen or Tylenol.  Patient also reports with right thumb and wrist pain after falling on outstretched hand while trying to get in a wheelchair from the waiting room.  He is unable to move his thumb.  Patient has pain all over as well as his chest.  HPI  Past Medical History:  Diagnosis Date  . Asthma     There are no active problems to display for this patient.   History reviewed. No pertinent surgical history.      Home Medications    Prior to Admission medications   Medication Sig Start Date End Date Taking? Authorizing Provider  acetaminophen (TYLENOL) 500 MG tablet Take 1 tablet (500 mg total) by mouth every 6 (six) hours as needed. 04/03/18   Bassy Fetterly, Waylan Boga, PA-C  albuterol (PROVENTIL HFA;VENTOLIN HFA) 108 (90 Base) MCG/ACT inhaler Inhale 2 puffs into the lungs every 4 (four) hours as needed for wheezing or shortness of breath. Patient not taking: Reported on 08/28/2016 03/14/16   Hayden Rasmussen, NP  benzonatate (TESSALON) 100 MG capsule Take 1 capsule (100 mg total) by mouth every 8 (eight) hours. 04/03/18   Nel Stoneking, Waylan Boga, PA-C  ondansetron (ZOFRAN ODT) 4 MG disintegrating tablet Take 1 tablet (4 mg total) by mouth every 8 (eight) hours as needed for nausea or vomiting. 03/30/18   Gerhard Munch, MD  ondansetron (ZOFRAN) 4 MG tablet Take 1 tablet (4 mg total) by mouth every 6 (six) hours. 04/03/18   Kandas Oliveto, Waylan Boga, PA-C  oseltamivir (TAMIFLU) 75 MG capsule Take 1  capsule (75 mg total) by mouth every 12 (twelve) hours. 03/30/18   Gerhard Munch, MD  predniSONE (DELTASONE) 50 MG tablet Take 1 tablet (50 mg total) by mouth daily with breakfast. 04/03/18   Emi Holes, PA-C    Family History History reviewed. No pertinent family history.  Social History Social History   Tobacco Use  . Smoking status: Former Smoker    Last attempt to quit: 07/29/2016    Years since quitting: 1.6  Substance Use Topics  . Alcohol use: Yes    Comment: occasionally  . Drug use: No     Allergies   Patient has no known allergies.   Review of Systems Review of Systems  Constitutional: Positive for activity change, appetite change, chills and fever.  HENT: Positive for congestion. Negative for ear pain, facial swelling and sore throat.   Respiratory: Positive for cough and shortness of breath.   Cardiovascular: Positive for chest pain.  Gastrointestinal: Positive for abdominal pain, nausea and vomiting (post-tussive and with nausea). Negative for diarrhea (resolving).  Genitourinary: Negative for dysuria.  Musculoskeletal: Positive for myalgias. Negative for back pain.  Skin: Negative for rash and wound.  Neurological: Negative for headaches.  Psychiatric/Behavioral: The patient is not nervous/anxious.      Physical Exam Updated Vital Signs BP 128/76   Pulse 95   Temp 98.4 F (36.9 C) (Oral)   Resp 20  SpO2 97%   Physical Exam Vitals signs and nursing note reviewed.  Constitutional:      General: He is not in acute distress.    Appearance: He is well-developed. He is ill-appearing. He is not diaphoretic.     Comments: Ill-appearing and uncomfortable appearing  HENT:     Head: Normocephalic and atraumatic.     Right Ear: Tympanic membrane normal.     Left Ear: Tympanic membrane normal.     Mouth/Throat:     Pharynx: Posterior oropharyngeal erythema present. No oropharyngeal exudate.  Eyes:     General: No scleral icterus.       Right eye:  No discharge.        Left eye: No discharge.     Conjunctiva/sclera: Conjunctivae normal.     Pupils: Pupils are equal, round, and reactive to light.  Neck:     Musculoskeletal: Normal range of motion and neck supple.     Thyroid: No thyromegaly.  Cardiovascular:     Rate and Rhythm: Normal rate and regular rhythm.     Heart sounds: Normal heart sounds. No murmur. No friction rub. No gallop.   Pulmonary:     Effort: Pulmonary effort is normal. No respiratory distress.     Breath sounds: Normal breath sounds. No stridor. No wheezing or rales.  Abdominal:     General: Bowel sounds are normal. There is no distension.     Palpations: Abdomen is soft.     Tenderness: There is abdominal tenderness in the right upper quadrant. There is no guarding or rebound.  Musculoskeletal:     Comments: Tenderness to the right thumb back to the wrist including anatomical snuffbox; pain also to the ulnar aspect of the wrist; patient will not move the thumb Sensation intact, cap refill less than 2 seconds  Lymphadenopathy:     Cervical: No cervical adenopathy.  Skin:    General: Skin is warm and dry.     Coloration: Skin is not pale.     Findings: No rash.  Neurological:     Mental Status: He is alert.     Coordination: Coordination normal.      ED Treatments / Results  Labs (all labs ordered are listed, but only abnormal results are displayed) Labs Reviewed  COMPREHENSIVE METABOLIC PANEL - Abnormal; Notable for the following components:      Result Value   CO2 20 (*)    Total Bilirubin 1.6 (*)    All other components within normal limits  CBC WITH DIFFERENTIAL/PLATELET - Abnormal; Notable for the following components:   Neutro Abs 7.9 (*)    Monocytes Absolute 1.1 (*)    All other components within normal limits  LIPASE, BLOOD    EKG None  Radiology Dg Chest 2 View  Result Date: 04/03/2018 CLINICAL DATA:  Initial evaluation for flu symptoms for 1 week. Cough, nausea. EXAM: CHEST - 2  VIEW COMPARISON:  Prior radiograph from 03/30/2018. FINDINGS: The cardiac and mediastinal silhouettes are stable in size and contour, and remain within normal limits. The lungs are normally inflated. No airspace consolidation, pleural effusion, or pulmonary edema is identified. There is no pneumothorax. No acute osseous abnormality identified. IMPRESSION: No radiographic evidence for active cardiopulmonary disease. Electronically Signed   By: Rise Mu M.D.   On: 04/03/2018 19:50   Dg Wrist Complete Right  Result Date: 04/03/2018 CLINICAL DATA:  Initial evaluation for acute trauma, fall. EXAM: RIGHT WRIST - COMPLETE 3+ VIEW COMPARISON:  None. FINDINGS: There  is no evidence of fracture or dislocation. There is no evidence of arthropathy or other focal bone abnormality. Soft tissues are unremarkable. IMPRESSION: Negative. Electronically Signed   By: Rise MuBenjamin  McClintock M.D.   On: 04/03/2018 20:00   Dg Hand Complete Right  Result Date: 04/03/2018 CLINICAL DATA:  Initial evaluation for acute trauma, fall. EXAM: RIGHT HAND - COMPLETE 3+ VIEW COMPARISON:  None. FINDINGS: There is no evidence of fracture or dislocation. There is no evidence of arthropathy or other focal bone abnormality. Soft tissues are unremarkable. IMPRESSION: Negative. Electronically Signed   By: Rise MuBenjamin  McClintock M.D.   On: 04/03/2018 19:58    Procedures Procedures (including critical care time)  Medications Ordered in ED Medications  sodium chloride 0.9 % bolus 1,000 mL (0 mLs Intravenous Stopped 04/03/18 1939)  ondansetron (ZOFRAN) injection 4 mg (4 mg Intravenous Given 04/03/18 1817)  ipratropium-albuterol (DUONEB) 0.5-2.5 (3) MG/3ML nebulizer solution 3 mL (3 mLs Nebulization Given 04/03/18 1817)  ketorolac (TORADOL) 30 MG/ML injection 30 mg (30 mg Intravenous Given 04/03/18 2028)  sodium chloride 0.9 % bolus 500 mL (0 mLs Intravenous Stopped 04/03/18 2046)     Initial Impression / Assessment and Plan / ED Course    I have reviewed the triage vital signs and the nursing notes.  Pertinent labs & imaging results that were available during my care of the patient were reviewed by me and considered in my medical decision making (see chart for details).     Patient presenting with continued body aches, cough, nausea, vomiting after being diagnosed with influenza A 4 days ago.  He is out of Zofran.  Labs are stable, AKI has improved from the other day.  Chest x-ray is clear.  Considering patient is asthmatic, will discharge home with 5-day burst of prednisone as this may be contributing to patient's continued symptoms.  Patient feeling better after DuoNeb in the ED.  Regarding patient's hand pain, he had a fall on outstretched hand.  X-rays are negative, however considering anatomical snuffbox tenderness, patient placed in thumb spica and given follow-up for repeat x-ray to hand.  He is advised not to take the brace off except for bathing.  He understands and agrees with plan.  Patient is feeling much better after IV fluids, Zofran, and Toradol.  Patient counseled on the importance of ibuprofen and Tylenol alternating, as patient has not been taking this. Patient discharged with Tessalon and Zofran as well.  Continue rest and fluids.  Return precautions discussed.  Patient vital stable and discharged in satisfactory condition.  Final Clinical Impressions(s) / ED Diagnoses   Final diagnoses:  Influenza    ED Discharge Orders         Ordered    ondansetron (ZOFRAN) 4 MG tablet  Every 6 hours     04/03/18 2039    benzonatate (TESSALON) 100 MG capsule  Every 8 hours     04/03/18 2039    acetaminophen (TYLENOL) 500 MG tablet  Every 6 hours PRN     04/03/18 2039    predniSONE (DELTASONE) 50 MG tablet  Daily with breakfast     04/03/18 2039           Emi HolesLaw, Tanay Misuraca M, PA-C 04/04/18 1102    Linwood DibblesKnapp, Jon, MD 04/05/18 [phone]

## 2018-04-03 NOTE — ED Triage Notes (Signed)
Pt in c/o continuing to feel bad, recently dx with the flu, continued cough and vomiting, diarrhea has improve but not resolved, unable to tolerate POs

## 2018-04-03 NOTE — ED Notes (Signed)
Pt stated to transporter that he preferred "black team" to help him rather then white.

## 2018-04-03 NOTE — ED Notes (Signed)
While pt was getting in wheelchair he leaned over and hit his thumb on waiting room bench. Pt seems weak and acted like he could not stand. Moaning and will not sit up.

## 2018-09-24 DIAGNOSIS — M19011 Primary osteoarthritis, right shoulder: Secondary | ICD-10-CM | POA: Insufficient documentation

## 2018-09-24 DIAGNOSIS — M75101 Unspecified rotator cuff tear or rupture of right shoulder, not specified as traumatic: Secondary | ICD-10-CM | POA: Insufficient documentation

## 2018-09-24 DIAGNOSIS — M25512 Pain in left shoulder: Secondary | ICD-10-CM | POA: Insufficient documentation

## 2018-09-24 DIAGNOSIS — M25511 Pain in right shoulder: Secondary | ICD-10-CM | POA: Insufficient documentation

## 2018-10-24 ENCOUNTER — Other Ambulatory Visit: Payer: Self-pay

## 2018-10-24 ENCOUNTER — Encounter (HOSPITAL_COMMUNITY): Payer: Self-pay | Admitting: Emergency Medicine

## 2018-10-24 ENCOUNTER — Emergency Department (HOSPITAL_COMMUNITY)
Admission: EM | Admit: 2018-10-24 | Discharge: 2018-10-24 | Disposition: A | Discharge status: Home / Self Care | Payer: No Typology Code available for payment source | Attending: Emergency Medicine | Admitting: Emergency Medicine

## 2018-10-24 DIAGNOSIS — R1110 Vomiting, unspecified: Secondary | ICD-10-CM | POA: Insufficient documentation

## 2018-10-24 DIAGNOSIS — Z5321 Procedure and treatment not carried out due to patient leaving prior to being seen by health care provider: Secondary | ICD-10-CM | POA: Diagnosis not present

## 2018-10-24 HISTORY — DX: Anxiety disorder, unspecified: F41.9

## 2018-10-24 HISTORY — DX: Post-traumatic stress disorder, unspecified: F43.10

## 2018-10-24 HISTORY — DX: Sleep apnea, unspecified: G47.30

## 2018-10-24 HISTORY — DX: Depression, unspecified: F32.A

## 2018-10-24 LAB — COMPREHENSIVE METABOLIC PANEL
ALT: 18 U/L (ref 0–44)
AST: 26 U/L (ref 15–41)
Albumin: 4.2 g/dL (ref 3.5–5.0)
Alkaline Phosphatase: 79 U/L (ref 38–126)
Anion gap: 13 (ref 5–15)
BUN: 9 mg/dL (ref 6–20)
CO2: 20 mmol/L — ABNORMAL LOW (ref 22–32)
Calcium: 9.5 mg/dL (ref 8.9–10.3)
Chloride: 106 mmol/L (ref 98–111)
Creatinine, Ser: 1.11 mg/dL (ref 0.61–1.24)
GFR calc Af Amer: >60 mL/min (ref >60)
GFR calc non Af Amer: >60 mL/min (ref >60)
Glucose, Bld: 102 mg/dL — ABNORMAL HIGH (ref 70–99)
Potassium: 4.1 mmol/L (ref 3.5–5.1)
Sodium: 139 mmol/L (ref 135–145)
Total Bilirubin: 1.8 mg/dL — ABNORMAL HIGH (ref 0.3–1.2)
Total Protein: 7.4 g/dL (ref 6.5–8.1)

## 2018-10-24 LAB — LIPASE, BLOOD: Lipase: 24 U/L (ref 11–51)

## 2018-10-24 LAB — CBC
HCT: 48.1 % (ref 39.0–52.0)
Hemoglobin: 16.5 g/dL (ref 13.0–17.0)
MCH: 31 pg (ref 26.0–34.0)
MCHC: 34.3 g/dL (ref 30.0–36.0)
MCV: 90.4 fL (ref 80.0–100.0)
Platelets: 258 10*3/uL (ref 150–400)
RBC: 5.32 MIL/uL (ref 4.22–5.81)
RDW: 12.6 % (ref 11.5–15.5)
WBC: 5.4 10*3/uL (ref 4.0–10.5)
nRBC: 0 % (ref 0.0–0.2)

## 2018-10-24 MED ORDER — SODIUM CHLORIDE 0.9% FLUSH: 3.0000 mL | Freq: Once | INTRAVENOUS | Status: DC

## 2018-10-24 NOTE — ED Triage Notes (Signed)
Pt reports emesis, weakness for the last 2 days. Denies recent fever. Denies dysuria. Denies SOB, diarrhea, localized abdominal pain. VSS.

## 2018-10-24 NOTE — ED Notes (Signed)
Pt stated to staff he couldn't wait to be seen. Seen leaving ED with steady gait.

## 2018-12-27 ENCOUNTER — Emergency Department (HOSPITAL_COMMUNITY)
Admission: EM | Admit: 2018-12-27 | Discharge: 2018-12-28 | Disposition: A | Discharge status: Home / Self Care | Payer: No Typology Code available for payment source | Attending: Emergency Medicine | Admitting: Emergency Medicine

## 2018-12-27 ENCOUNTER — Encounter (HOSPITAL_COMMUNITY): Payer: Self-pay | Admitting: Emergency Medicine

## 2018-12-27 ENCOUNTER — Other Ambulatory Visit: Payer: Self-pay

## 2018-12-27 DIAGNOSIS — R112 Nausea with vomiting, unspecified: Secondary | ICD-10-CM | POA: Insufficient documentation

## 2018-12-27 DIAGNOSIS — N200 Calculus of kidney: Secondary | ICD-10-CM | POA: Insufficient documentation

## 2018-12-27 DIAGNOSIS — J45909 Unspecified asthma, uncomplicated: Secondary | ICD-10-CM | POA: Insufficient documentation

## 2018-12-27 DIAGNOSIS — R1031 Right lower quadrant pain: Secondary | ICD-10-CM | POA: Diagnosis present

## 2018-12-27 DIAGNOSIS — Z87891 Personal history of nicotine dependence: Secondary | ICD-10-CM | POA: Diagnosis not present

## 2018-12-27 LAB — CBC
HCT: 45.3 % (ref 39.0–52.0)
Hemoglobin: 15.5 g/dL (ref 13.0–17.0)
MCH: 31.7 pg (ref 26.0–34.0)
MCHC: 34.2 g/dL (ref 30.0–36.0)
MCV: 92.6 fL (ref 80.0–100.0)
Platelets: 235 10*3/uL (ref 150–400)
RBC: 4.89 MIL/uL (ref 4.22–5.81)
RDW: 12.8 % (ref 11.5–15.5)
WBC: 15.9 10*3/uL — ABNORMAL HIGH (ref 4.0–10.5)
nRBC: 0 % (ref 0.0–0.2)

## 2018-12-27 LAB — COMPREHENSIVE METABOLIC PANEL
ALT: 21 U/L (ref 0–44)
AST: 21 U/L (ref 15–41)
Albumin: 4.6 g/dL (ref 3.5–5.0)
Alkaline Phosphatase: 91 U/L (ref 38–126)
Anion gap: 12 (ref 5–15)
BUN: 12 mg/dL (ref 6–20)
CO2: 20 mmol/L — ABNORMAL LOW (ref 22–32)
Calcium: 8.8 mg/dL — ABNORMAL LOW (ref 8.9–10.3)
Chloride: 106 mmol/L (ref 98–111)
Creatinine, Ser: 1.22 mg/dL (ref 0.61–1.24)
GFR calc Af Amer: >60 mL/min (ref >60)
GFR calc non Af Amer: >60 mL/min (ref >60)
Glucose, Bld: 128 mg/dL — ABNORMAL HIGH (ref 70–99)
Potassium: 3.5 mmol/L (ref 3.5–5.1)
Sodium: 138 mmol/L (ref 135–145)
Total Bilirubin: 0.9 mg/dL (ref 0.3–1.2)
Total Protein: 7.2 g/dL (ref 6.5–8.1)

## 2018-12-27 LAB — LIPASE, BLOOD: Lipase: 31 U/L (ref 11–51)

## 2018-12-27 MED ORDER — SODIUM CHLORIDE 0.9% FLUSH: 3.0000 mL | Freq: Once | INTRAVENOUS | Status: DC

## 2018-12-27 NOTE — ED Provider Notes (Signed)
TIME SEEN: 11:56 PM  CHIEF COMPLAINT: Sudden onset right lower quadrant pain  HPI: Patient is a 28 year old male with no significant past medical history presents to the emergency department with sudden onset right flank and right lower quadrant pain.  Described as sharp, severe associated with nausea and vomiting.  No diarrhea.  No fevers, cough, chest pain, shortness of breath.  No previous abdominal surgery.  States he thinks he is having a kidney stone or a ruptured appendix.  Has never had a kidney stone before.  Symptoms started suddenly while at a cookout tonight.  ROS: See HPI Constitutional: no fever  Eyes: no drainage  ENT: no runny nose   Cardiovascular:  no chest pain  Resp: no SOB  GI:  vomiting GU: no dysuria Integumentary: no rash  Allergy: no hives  Musculoskeletal: no leg swelling  Neurological: no slurred speech ROS otherwise negative  PAST MEDICAL HISTORY/PAST SURGICAL HISTORY:  Past Medical History:  Diagnosis Date  . Anxiety   . Asthma   . Depression   . PTSD (post-traumatic stress disorder)   . Sleep apnea     MEDICATIONS:  Prior to Admission medications   Medication Sig Start Date End Date Taking? Authorizing Provider  acetaminophen (TYLENOL) 500 MG tablet Take 1 tablet (500 mg total) by mouth every 6 (six) hours as needed. 04/03/18   Law, Bea Graff, PA-C  albuterol (PROVENTIL HFA;VENTOLIN HFA) 108 (90 Base) MCG/ACT inhaler Inhale 2 puffs into the lungs every 4 (four) hours as needed for wheezing or shortness of breath. Patient not taking: Reported on 08/28/2016 03/14/16   Janne Napoleon, NP  benzonatate (TESSALON) 100 MG capsule Take 1 capsule (100 mg total) by mouth every 8 (eight) hours. 04/03/18   Law, Bea Graff, PA-C  ondansetron (ZOFRAN ODT) 4 MG disintegrating tablet Take 1 tablet (4 mg total) by mouth every 8 (eight) hours as needed for nausea or vomiting. 03/30/18   Carmin Muskrat, MD  ondansetron (ZOFRAN) 4 MG tablet Take 1 tablet (4 mg total) by  mouth every 6 (six) hours. 04/03/18   Law, Bea Graff, PA-C  oseltamivir (TAMIFLU) 75 MG capsule Take 1 capsule (75 mg total) by mouth every 12 (twelve) hours. 03/30/18   Carmin Muskrat, MD  predniSONE (DELTASONE) 50 MG tablet Take 1 tablet (50 mg total) by mouth daily with breakfast. 04/03/18   Law, Bea Graff, PA-C    ALLERGIES:  No Known Allergies  SOCIAL HISTORY:  Social History   Tobacco Use  . Smoking status: Former Smoker    Quit date: 07/29/2016    Years since quitting: 2.4  Substance Use Topics  . Alcohol use: Not Currently    Comment: occasionally    FAMILY HISTORY: No family history on file.  EXAM: BP (!) 161/100 (BP Location: Right Arm)   Pulse 74   Temp 97.9 F (36.6 C) (Oral)   Resp 20   SpO2 100%  CONSTITUTIONAL: Alert and oriented and responds appropriately to questions.  Patient appears uncomfortable and is standing upright hunched over the bed HEAD: Normocephalic EYES: Conjunctivae clear, pupils appear equal, EOMI ENT: normal nose; moist mucous membranes NECK: Supple, no meningismus, no nuchal rigidity, no LAD  CARD: RRR; S1 and S2 appreciated; no murmurs, no clicks, no rubs, no gallops RESP: Normal chest excursion without splinting or tachypnea; breath sounds clear and equal bilaterally; no wheezes, no rhonchi, no rales, no hypoxia or respiratory distress, speaking full sentences ABD/GI: Normal bowel sounds; non-distended; soft, tender to palpation diffusely worse on  the right lower abdomen, no rebound, no guarding, no peritoneal signs, no hepatosplenomegaly BACK:  The back appears normal and is non-tender to palpation, there is no CVA tenderness EXT: Normal ROM in all joints; non-tender to palpation; no edema; normal capillary refill; no cyanosis, no calf tenderness or swelling    SKIN: Normal color for age and race; warm; no rash NEURO: Moves all extremities equally PSYCH: The patient's mood and manner are appropriate. Grooming and personal hygiene are  appropriate.  MEDICAL DECISION MAKING: Patient here with sudden onset right-sided abdominal pain.  Suspicion for kidney stone is high.  Appendicitis on the differential but less likely given presentation.  Differential also includes perforation, colitis, diverticulitis, cholecystitis, pancreatitis dissection.  Will obtain urinalysis.  Labs unremarkable other than leukocytosis of 15.9.  Will give pain medicine, nausea medicine and IV fluids.  Will obtain CT of the abdomen pelvis without contrast.  ED PROGRESS: Patient CT scan shows a 3 mm right UVJ calculus causing mild right hydronephrosis.  Pain is been well controlled after Dilaudid and Toradol.  No longer vomiting and able to tolerate p.o.  Urine does not appear infected but does show blood and large ketones.  He has received 2 L of IV fluids in the emergency department.  I feel he is safe to be discharged home with pain and nausea medicine, Flomax, outpatient urology follow-up.  Discussed return precautions.   At this time, I do not feel there is any life-threatening condition present. I have reviewed, interpreted and discussed all results (EKG, imaging, lab, urine as appropriate) and exam findings with patient/family. I have reviewed nursing notes and appropriate previous records.  I feel the patient is safe to be discharged home without further emergent workup and can continue workup as an outpatient as needed. Discussed usual and customary return precautions. Patient/family verbalize understanding and are comfortable with this plan.  Outpatient follow-up has been provided as needed. All questions have been answered.   Gregory Curtis was evaluated in Emergency Department on 12/27/2018 for the symptoms described in the history of present illness. He was evaluated in the context of the global COVID-19 pandemic, which necessitated consideration that the patient might be at risk for infection with the SARS-CoV-2 virus that causes COVID-19. Institutional  protocols and algorithms that pertain to the evaluation of patients at risk for COVID-19 are in a state of rapid change based on information released by regulatory bodies including the CDC and federal and state organizations. These policies and algorithms were followed during the patient's care in the ED.    Angely Dietz, Layla Maw, DO 12/28/18 [phone]

## 2018-12-27 NOTE — ED Triage Notes (Addendum)
Arrives via EMS, patient had cookout for dinner and now has RLQ abd pain. One episode of vomiting per patient, 4mg  Zofran, 50 mcg Fentanyl given IV. Patient moaning and thrashing about in the wheelchair.

## 2018-12-28 ENCOUNTER — Emergency Department (HOSPITAL_COMMUNITY): Payer: No Typology Code available for payment source

## 2018-12-28 LAB — URINALYSIS, ROUTINE W REFLEX MICROSCOPIC
Bacteria, UA: NONE SEEN
Bilirubin Urine: NEGATIVE
Glucose, UA: NEGATIVE mg/dL
Ketones, ur: 80 mg/dL — AB
Leukocytes,Ua: NEGATIVE
Nitrite: NEGATIVE
Protein, ur: 30 mg/dL — AB
RBC / HPF: >50 RBC/hpf — ABNORMAL HIGH (ref 0–5)
Specific Gravity, Urine: 1.032 — ABNORMAL HIGH (ref 1.005–1.030)
pH: 5 (ref 5.0–8.0)

## 2018-12-28 MED ORDER — HYDROMORPHONE HCL 1 MG/ML IJ SOLN
1.0000 mg | Freq: Once | INTRAMUSCULAR | Status: AC
  Administered 2018-12-28: 1 mg via INTRAVENOUS
  Filled 2018-12-28: qty 1

## 2018-12-28 MED ORDER — IBUPROFEN 800 MG PO TABS: 800.0000 mg | ORAL_TABLET | Freq: Three times a day (TID) | ORAL | 0 refills | Status: AC | PRN

## 2018-12-28 MED ORDER — SODIUM CHLORIDE 0.9 % IV BOLUS (SEPSIS)
1000.0000 mL | Freq: Once | INTRAVENOUS | Status: AC
  Administered 2018-12-28: 1000 mL via INTRAVENOUS

## 2018-12-28 MED ORDER — OXYCODONE-ACETAMINOPHEN 5-325 MG PO TABS: 2.0000 | ORAL_TABLET | Freq: Four times a day (QID) | ORAL | 0 refills | Status: DC | PRN

## 2018-12-28 MED ORDER — ONDANSETRON HCL 4 MG/2ML IJ SOLN
4.0000 mg | Freq: Once | INTRAMUSCULAR | Status: AC
  Administered 2018-12-28: 4 mg via INTRAVENOUS
  Filled 2018-12-28: qty 2

## 2018-12-28 MED ORDER — TAMSULOSIN HCL 0.4 MG PO CAPS: 0.4000 mg | ORAL_CAPSULE | Freq: Every day | ORAL | 0 refills | Status: DC

## 2018-12-28 MED ORDER — ONDANSETRON 4 MG PO TBDP: 4.0000 mg | ORAL_TABLET | Freq: Four times a day (QID) | ORAL | 0 refills | Status: DC | PRN

## 2018-12-28 MED ORDER — KETOROLAC TROMETHAMINE 30 MG/ML IJ SOLN
30.0000 mg | Freq: Once | INTRAMUSCULAR | Status: AC
  Administered 2018-12-28: 30 mg via INTRAVENOUS
  Filled 2018-12-28: qty 1

## 2018-12-28 NOTE — ED Notes (Signed)
Pt provided with urine strainer

## 2018-12-28 NOTE — ED Notes (Signed)
Upon entering room pt leaned over the bed in pain unable to sit. Medication given pt now resting in bed and stated pain is much better.

## 2018-12-28 NOTE — ED Notes (Signed)
Patient transported to CT 

## 2018-12-28 NOTE — ED Notes (Signed)
Pt provided water.  

## 2018-12-28 NOTE — Discharge Instructions (Signed)

## 2019-10-23 ENCOUNTER — Other Ambulatory Visit: Payer: Self-pay

## 2020-08-13 IMAGING — CT CT RENAL STONE PROTOCOL
2 of 4 series · 16 of 46 positions shown, 18 images · non-contrast
Comparison: None.

CLINICAL DATA: Right lower flank pain

EXAM:
CT ABDOMEN AND PELVIS WITHOUT CONTRAST
TECHNIQUE: Multidetector CT imaging of the abdomen and pelvis was performed
following the standard protocol without IV contrast.

[Series 2: axial st · axial · 0.71mm/px · z∈[-513,-108]mm · 13 of 91 slices shown, 15 images]
[im 5/91  soft-tissue]
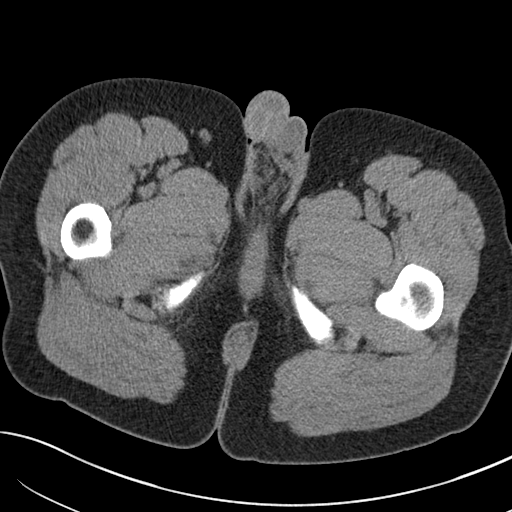
[im 5/91  bone]
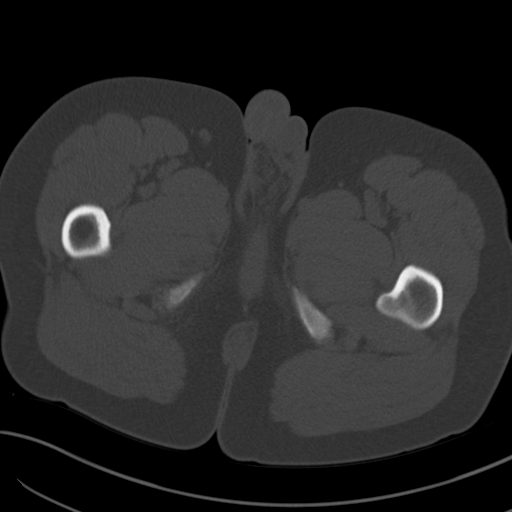
[im 15/91  soft-tissue]
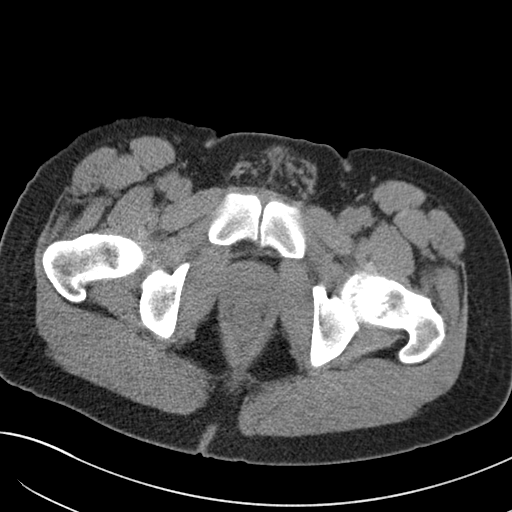
[im 19/91  soft-tissue]
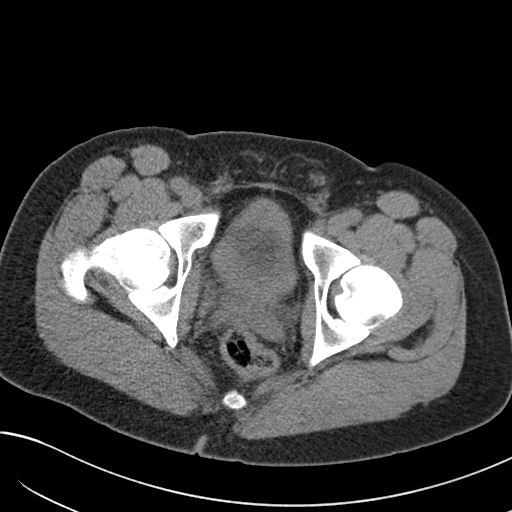
[im 24/91  soft-tissue]
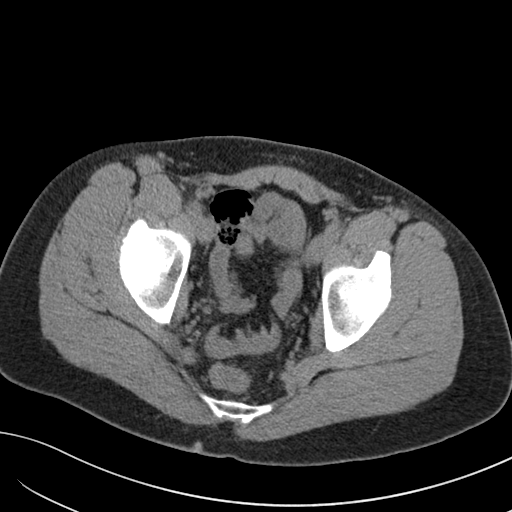
[im 34/91  soft-tissue]
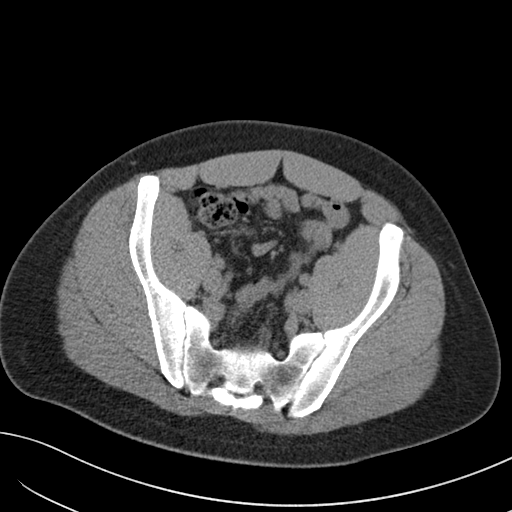
[im 38/91  soft-tissue]
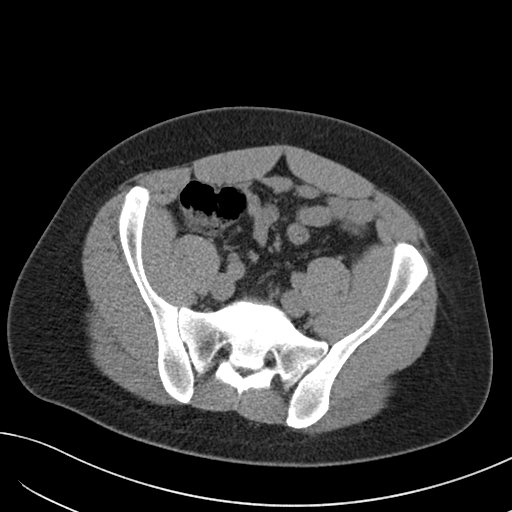
[im 48/91  soft-tissue]
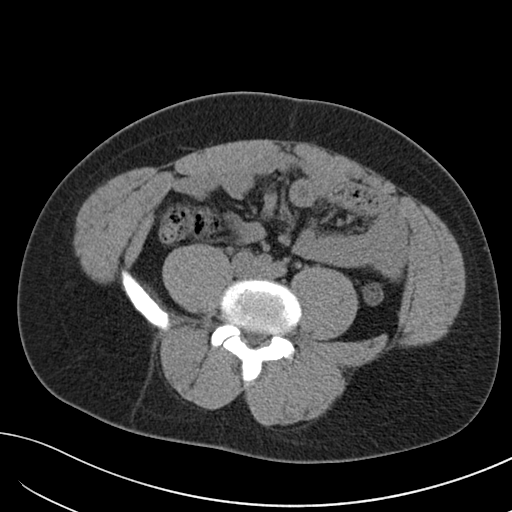
[im 53/91  soft-tissue]
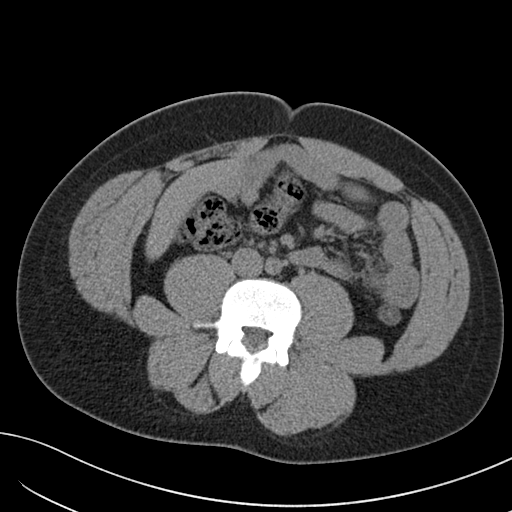
[im 57/91  soft-tissue]
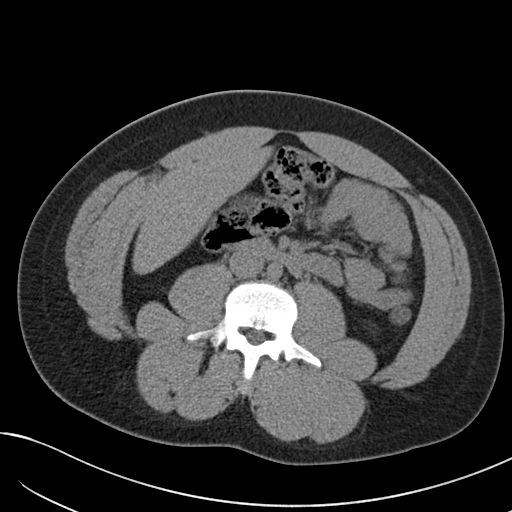
[im 57/91  bone]
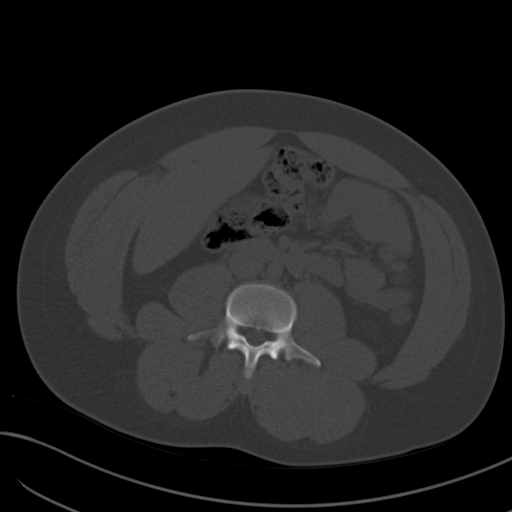
[im 67/91  soft-tissue]
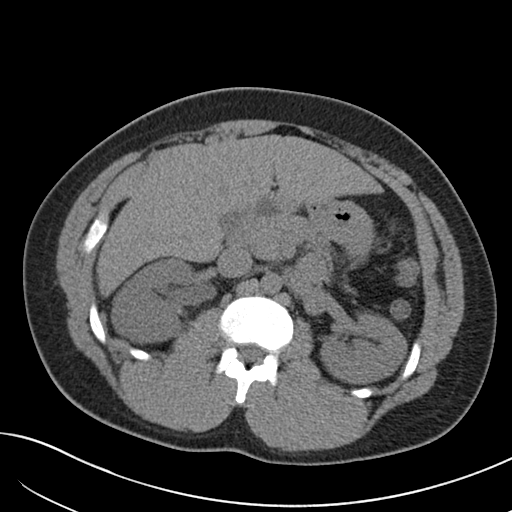
[im 72/91  soft-tissue]
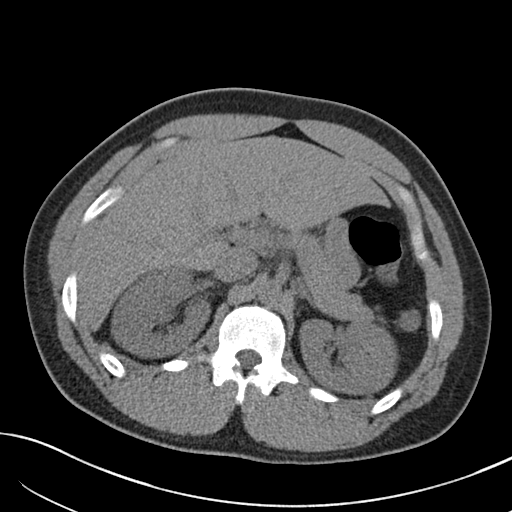
[im 76/91  soft-tissue]
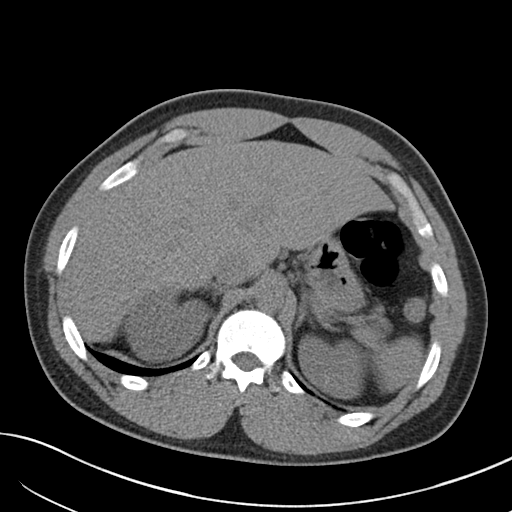
[im 86/91  soft-tissue]
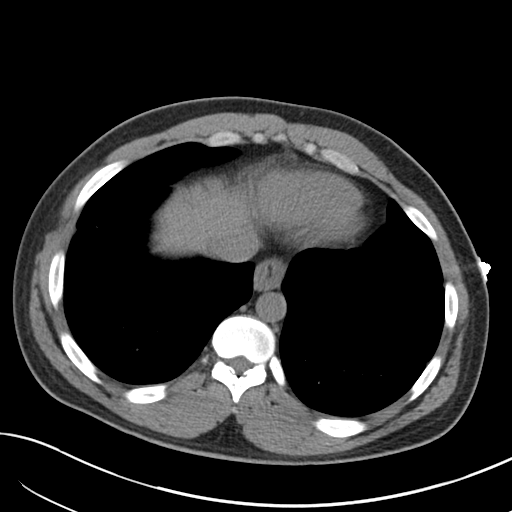

[Series 5: coronal · coronal · 0.71mm/px · 3 of 141 slices shown]
[im 47/141  soft-tissue]
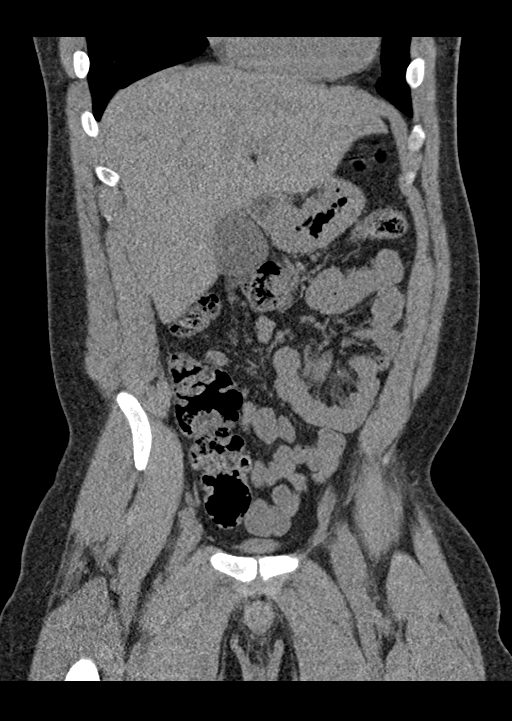
[im 63/141  soft-tissue]
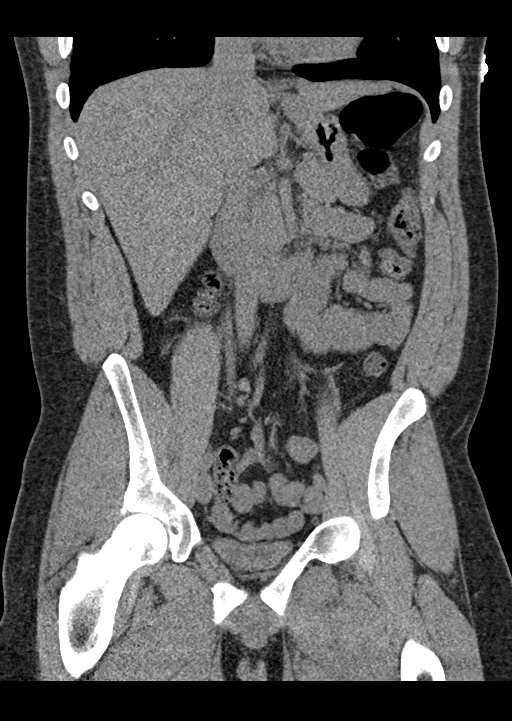
[im 78/141  soft-tissue]
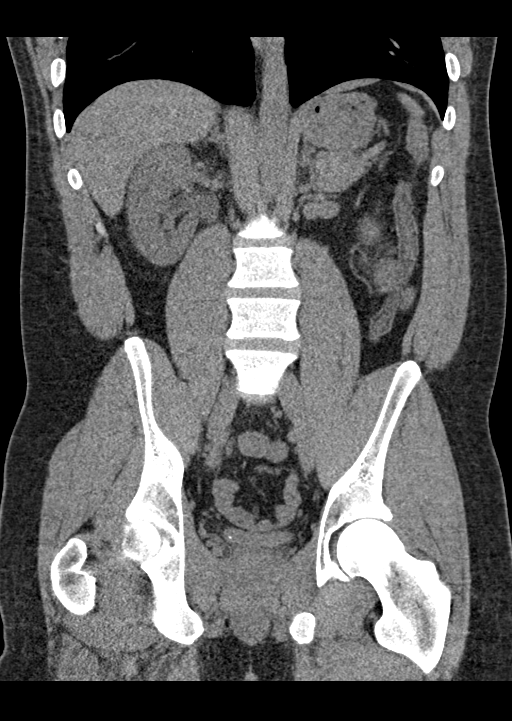

[16 of 46 positions shown; findings below may reference images not displayed]

FINDINGS: Lower chest: The visualized heart size within normal limits. No
pericardial fluid/thickening.

No hiatal hernia.

The visualized portions of the lungs are clear.

Hepatobiliary: Although limited due to the lack of intravenous
contrast, normal in appearance without gross focal abnormality. No
evidence of calcified gallstones or biliary ductal dilatation.

Pancreas:  Unremarkable.  No surrounding inflammatory changes.

Spleen: Normal in size. Although limited due to the lack of
intravenous contrast, normal in appearance.

Adrenals/Urinary Tract: Both adrenal glands appear normal. There is
a 3 mm calculus seen at the right UVJ causing mild right ureteral
dilatation and periureteral stranding. There is mild right
pelvicaliectasis is well. No other renal or bladder calculi are
noted.

Stomach/Bowel: The stomach, small bowel, and colon are normal in
appearance. No inflammatory changes or obstructive findings.
appendix is normal.

Vascular/Lymphatic: There are no enlarged abdominal or pelvic lymph
nodes. No significant gross vascular findings are present.

Reproductive: The prostate is unremarkable.

Other: No evidence of abdominal wall mass or hernia.

Musculoskeletal: No acute or significant osseous findings.
IMPRESSION: 3 mm right UVJ calculus causing mild right hydronephrosis.

## 2021-04-05 ENCOUNTER — Encounter: Payer: Self-pay | Admitting: *Deleted

## 2021-04-08 ENCOUNTER — Encounter: Payer: Self-pay | Admitting: Diagnostic Neuroimaging

## 2021-04-08 ENCOUNTER — Other Ambulatory Visit: Payer: Self-pay

## 2021-04-08 ENCOUNTER — Ambulatory Visit (INDEPENDENT_AMBULATORY_CARE_PROVIDER_SITE_OTHER): Payer: No Typology Code available for payment source | Admitting: Diagnostic Neuroimaging

## 2021-04-08 VITALS — BP 105/70 | HR 77 | Ht 73.0 in | Wt 217.6 lb

## 2021-04-08 DIAGNOSIS — M79641 Pain in right hand: Secondary | ICD-10-CM

## 2021-04-08 DIAGNOSIS — H547 Unspecified visual loss: Secondary | ICD-10-CM | POA: Insufficient documentation

## 2021-04-08 DIAGNOSIS — F17200 Nicotine dependence, unspecified, uncomplicated: Secondary | ICD-10-CM | POA: Insufficient documentation

## 2021-04-08 DIAGNOSIS — Z5900 Homelessness unspecified: Secondary | ICD-10-CM | POA: Insufficient documentation

## 2021-04-08 DIAGNOSIS — T691XXA Chilblains, initial encounter: Secondary | ICD-10-CM | POA: Insufficient documentation

## 2021-04-08 DIAGNOSIS — T671XXA Heat syncope, initial encounter: Secondary | ICD-10-CM | POA: Insufficient documentation

## 2021-04-08 DIAGNOSIS — R7303 Prediabetes: Secondary | ICD-10-CM | POA: Insufficient documentation

## 2021-04-08 DIAGNOSIS — F4310 Post-traumatic stress disorder, unspecified: Secondary | ICD-10-CM | POA: Insufficient documentation

## 2021-04-08 DIAGNOSIS — M6282 Rhabdomyolysis: Secondary | ICD-10-CM | POA: Insufficient documentation

## 2021-04-08 DIAGNOSIS — Z789 Other specified health status: Secondary | ICD-10-CM | POA: Insufficient documentation

## 2021-04-08 DIAGNOSIS — G5621 Lesion of ulnar nerve, right upper limb: Secondary | ICD-10-CM | POA: Insufficient documentation

## 2021-04-08 DIAGNOSIS — M549 Dorsalgia, unspecified: Secondary | ICD-10-CM | POA: Insufficient documentation

## 2021-04-08 DIAGNOSIS — J449 Chronic obstructive pulmonary disease, unspecified: Secondary | ICD-10-CM | POA: Insufficient documentation

## 2021-04-08 DIAGNOSIS — F4321 Adjustment disorder with depressed mood: Secondary | ICD-10-CM | POA: Insufficient documentation

## 2021-04-08 DIAGNOSIS — G4733 Obstructive sleep apnea (adult) (pediatric): Secondary | ICD-10-CM | POA: Insufficient documentation

## 2021-04-08 DIAGNOSIS — J039 Acute tonsillitis, unspecified: Secondary | ICD-10-CM | POA: Insufficient documentation

## 2021-04-08 DIAGNOSIS — E785 Hyperlipidemia, unspecified: Secondary | ICD-10-CM | POA: Insufficient documentation

## 2021-04-08 DIAGNOSIS — F1220 Cannabis dependence, uncomplicated: Secondary | ICD-10-CM | POA: Insufficient documentation

## 2021-04-08 DIAGNOSIS — M542 Cervicalgia: Secondary | ICD-10-CM | POA: Insufficient documentation

## 2021-04-08 DIAGNOSIS — H04123 Dry eye syndrome of bilateral lacrimal glands: Secondary | ICD-10-CM | POA: Insufficient documentation

## 2021-04-08 DIAGNOSIS — R1013 Epigastric pain: Secondary | ICD-10-CM | POA: Insufficient documentation

## 2021-04-08 DIAGNOSIS — U071 COVID-19: Secondary | ICD-10-CM | POA: Insufficient documentation
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
## 2021-04-08 NOTE — Patient Instructions (Signed)
°  RIGHT HAND / ARM PAIN / NUMBNESS - check EMG/NCS (eval for carpal tunnel syndrome vs pinched nerve in neck) - wear carpal tunnel wrist brace at bedtime  RIGHT SHOULDER PAIN / IMPINGEMENT - consider sports medicine, orthopedic evaluation

## 2021-04-25 ENCOUNTER — Encounter (INDEPENDENT_AMBULATORY_CARE_PROVIDER_SITE_OTHER): Payer: Non-veteran care | Admitting: Diagnostic Neuroimaging

## 2021-04-25 ENCOUNTER — Ambulatory Visit (INDEPENDENT_AMBULATORY_CARE_PROVIDER_SITE_OTHER): Payer: No Typology Code available for payment source | Admitting: Diagnostic Neuroimaging

## 2021-04-25 DIAGNOSIS — Z0289 Encounter for other administrative examinations: Secondary | ICD-10-CM

## 2021-04-25 DIAGNOSIS — M79641 Pain in right hand: Secondary | ICD-10-CM | POA: Diagnosis not present

## 2021-04-25 NOTE — Procedures (Signed)
? ?  [facility] ? ?NCS (NERVE CONDUCTION STUDY) WITH EMG (ELECTROMYOGRAPHY) REPORT ? ? ?STUDY DATE: 04/25/21 ?PATIENT NAME: Gregory Curtis ?DOB: [birth year] ?MRN: [id number] ? ?ORDERING CLINICIAN: Joycelyn Schmid, MD  ? ?TECHNOLOGIST: Durenda Age ?ELECTROMYOGRAPHER: Kip Kautzman R. Aikam Hellickson, MD ? ?CLINICAL INFORMATION: 31 year old male with right arm pain and numbness. ? ?FINDINGS: ?NERVE CONDUCTION STUDY: ?Right median and right ulnar motor responses are normal. ? ?Right median and right ulnar sensory responses are normal. ? ?Right median to ulnar transcarpal comparison study is normal. ? ?Right ulnar F-wave latency is normal. ? ? ?NEEDLE ELECTROMYOGRAPHY: ? ?Needle examination of right upper extremity is normal. ? ? ?IMPRESSION:  ? ?Normal study.  No electrodiagnostic evidence of large fiber neuropathy at this time. ? ? ?INTERPRETING PHYSICIAN:  ?Suanne Marker, MD ?Certified in Neurology, Neurophysiology and Neuroimaging ? ?[facility] ?[address], [address] ?[place], [place] [address] ?[phone] ? ? ?[facility] ?   ?Nerve / Sites Muscle Latency Ref. Amplitude Ref. Rel Amp Segments Distance Velocity Ref. Area  ?  ms ms mV mV %  cm m/s m/s mVms  ?R Median - APB  ?   Wrist APB 3.4 ?4.4 9.2 ?4.0 100 Wrist - APB 7   34.0  ?   Upper arm APB 7.6  8.9  96.8 Upper arm - Wrist 23 55 ?49 32.9  ?R Ulnar - ADM  ?   Wrist ADM 2.3 ?3.3 11.8 ?6.0 100 Wrist - ADM 7   43.6  ?   B.Elbow ADM 6.2  11.5  96.8 B.Elbow - Wrist 23 59 ?49 42.9  ?   A.Elbow ADM 7.9  11.2  97.6 A.Elbow - B.Elbow 10 58 ?49 44.6  ?       ?SNC ?   ?Nerve / Sites Rec. Site Peak Lat Ref.  Amp Ref. Segments Distance Peak Diff Ref.  ?  ms ms ?V ?V  cm ms ms  ?R Median, Ulnar - Transcarpal comparison  ?   Median Palm Wrist 2.0 ?2.2 88 ?35 Median Palm - Wrist 8    ?   Ulnar Palm Wrist 1.8 ?2.2 42 ?12 Ulnar Palm - Wrist 8    ?      Median Palm - Ulnar Palm  0.2 ?0.4  ?R Median - Orthodromic (Dig II, Mid palm)  ?   Dig II Wrist 3.0 ?3.4 22  ?10 Dig II - Wrist 13    ?R Ulnar - Orthodromic, (Dig V, Mid palm)  ?   Dig V Wrist 2.4 ?3.1 16 ?5 Dig V - Wrist 11    ?         ?F  Wave ?   ?Nerve F Lat Ref.  ? ms ms  ?R Ulnar - ADM 29.1 ?32.0  ?     ?EMG Summary Table   ? Spontaneous MUAP Recruitment  ?Muscle IA Fib PSW Fasc Other Amp Dur. Poly Pattern  ?R. Deltoid Normal None None None _______ Normal Normal Normal Normal  ?R. Biceps brachii Normal None None None _______ Normal Normal Normal Normal  ?R. Triceps brachii Normal None None None _______ Normal Normal Normal Normal  ?R. Flexor carpi radialis Normal None None None _______ Normal Normal Normal Normal  ?R. First dorsal interosseous Normal None None None _______ Normal Normal Normal Normal  ? ?  ?

## 2021-05-26 ENCOUNTER — Encounter: Payer: Non-veteran care | Admitting: Diagnostic Neuroimaging

## 2021-12-30 ENCOUNTER — Encounter: Payer: Self-pay | Admitting: Diagnostic Neuroimaging

## 2022-09-06 ENCOUNTER — Encounter (HOSPITAL_COMMUNITY): Payer: Self-pay

## 2022-09-06 ENCOUNTER — Emergency Department (HOSPITAL_COMMUNITY)
Admission: EM | Admit: 2022-09-06 | Discharge: 2022-09-06 | Disposition: A | Discharge status: Home / Self Care | Payer: No Typology Code available for payment source | Attending: Emergency Medicine | Admitting: Emergency Medicine

## 2022-09-06 ENCOUNTER — Emergency Department (HOSPITAL_COMMUNITY): Payer: No Typology Code available for payment source

## 2022-09-06 DIAGNOSIS — R0602 Shortness of breath: Secondary | ICD-10-CM | POA: Diagnosis not present

## 2022-09-06 DIAGNOSIS — R0789 Other chest pain: Secondary | ICD-10-CM | POA: Insufficient documentation

## 2022-09-06 DIAGNOSIS — R079 Chest pain, unspecified: Secondary | ICD-10-CM | POA: Diagnosis present

## 2022-09-06 DIAGNOSIS — R1013 Epigastric pain: Secondary | ICD-10-CM | POA: Diagnosis not present

## 2022-09-06 DIAGNOSIS — J45909 Unspecified asthma, uncomplicated: Secondary | ICD-10-CM | POA: Diagnosis not present

## 2022-09-06 DIAGNOSIS — S40811A Abrasion of right upper arm, initial encounter: Secondary | ICD-10-CM | POA: Diagnosis not present

## 2022-09-06 DIAGNOSIS — X58XXXA Exposure to other specified factors, initial encounter: Secondary | ICD-10-CM | POA: Diagnosis not present

## 2022-09-06 LAB — CBC
HCT: 47.3 % (ref 39.0–52.0)
Hemoglobin: 15.9 g/dL (ref 13.0–17.0)
MCH: 31.9 pg (ref 26.0–34.0)
MCHC: 33.6 g/dL (ref 30.0–36.0)
MCV: 95 fL (ref 80.0–100.0)
Platelets: 251 10*3/uL (ref 150–400)
RBC: 4.98 MIL/uL (ref 4.22–5.81)
RDW: 13.2 % (ref 11.5–15.5)
WBC: 6.9 10*3/uL (ref 4.0–10.5)
nRBC: 0 % (ref 0.0–0.2)

## 2022-09-06 LAB — TROPONIN I (HIGH SENSITIVITY): Troponin I (High Sensitivity): 4 ng/L (ref <18)

## 2022-09-06 LAB — BASIC METABOLIC PANEL
Anion gap: 9 (ref 5–15)
BUN: 8 mg/dL (ref 6–20)
CO2: 23 mmol/L (ref 22–32)
Calcium: 9.1 mg/dL (ref 8.9–10.3)
Chloride: 107 mmol/L (ref 98–111)
Creatinine, Ser: 0.96 mg/dL (ref 0.61–1.24)
GFR, Estimated: >60 mL/min (ref >60)
Glucose, Bld: 93 mg/dL (ref 70–99)
Potassium: 4.1 mmol/L (ref 3.5–5.1)
Sodium: 139 mmol/L (ref 135–145)

## 2022-09-06 MED ORDER — PANTOPRAZOLE SODIUM 40 MG PO TBEC: 40.0000 mg | DELAYED_RELEASE_TABLET | Freq: Every day | ORAL | 0 refills | Status: AC

## 2022-09-06 MED ORDER — ALUM & MAG HYDROXIDE-SIMETH 200-200-20 MG/5ML PO SUSP
30.0000 mL | Freq: Once | ORAL | Status: AC
  Administered 2022-09-06: 30 mL via ORAL
  Filled 2022-09-06: qty 30

## 2022-09-06 MED ORDER — LIDOCAINE VISCOUS HCL 2 % MT SOLN
15.0000 mL | Freq: Once | OROMUCOSAL | Status: DC
  Filled 2022-09-06: qty 15

## 2022-09-06 NOTE — ED Provider Notes (Signed)
[place] EM[facility]ENC[facility]MENT AT [facility] Provider Note   CSN: [id number] Arrival date & time: 09/06/22  1056     History  Chief Complaint  Patient presents with   Chest Pain   Shortness of Breath    Gregory Curtis is a 32 y.o. male.   Chest Pain Associated symptoms: shortness of breath   Shortness of Breath Associated symptoms: chest pain   Patient presents for chest pain and shortness of breath.  Medical history includes anxiety, asthma, depression, HLD, sleep apnea, homelessness, prediabetes, cannabis dependence.  Patient awoke this morning in his normal state of health.  At around 7 AM, while at rest, he had sudden onset of lower chest/epigastric pain.  He had not eaten prior to this.  Due to the pain, he did have a syncopal episode.  When he fell, he did strike the posterior aspect of his upper right arm.  He went to the [place] where they obtained an EKG.  They advised him to come to the ED for further evaluation.  Since the episode this morning, pain has gradually diminished.  Currently it is 5/10 in severity.  He denies any other current symptoms.     Home Medications Prior to Admission medications   Medication Sig Start Date End Date Taking? Authorizing Provider  pantoprazole (PROTONIX) 40 MG tablet Take 1 tablet (40 mg total) by mouth daily. 09/06/22 10/06/22 Yes Gloris Manchester, MD  albuterol (PROVENTIL HFA;VENTOLIN HFA) 108 (90 Base) MCG/ACT inhaler Inhale 2 puffs into the lungs every 4 (four) hours as needed for wheezing or shortness of breath. 03/14/16   Hayden Rasmussen, NP  ibuprofen (ADVIL) 800 MG tablet Take 1 tablet (800 mg total) by mouth every 8 (eight) hours as needed for mild pain. 12/28/18   Ward, Layla Maw, DO  prazosin (MINIPRESS) 2 MG capsule Take 2 mg by mouth at bedtime.     [provider]  sertraline (ZOLOFT) 100 MG tablet Take 100 mg by mouth daily.     [provider]      Allergies    Methylprednisolone    Review of Systems    Review of Systems  Respiratory:  Positive for shortness of breath.   Cardiovascular:  Positive for chest pain.  All other systems reviewed and are negative.   Physical Exam Updated Vital Signs BP 124/78   Pulse 79   Temp 98 F (36.7 C)   Resp 18   Ht 6' (1.829 m)   Wt 88.5 kg   SpO2 99%   BMI 26.45 kg/m  Physical Exam Vitals and nursing note reviewed.  Constitutional:      General: He is not in acute distress.    Appearance: He is well-developed. He is not ill-appearing, toxic-appearing or diaphoretic.  HENT:     Head: Normocephalic and atraumatic.  Eyes:     Conjunctiva/sclera: Conjunctivae normal.  Cardiovascular:     Rate and Rhythm: Normal rate and regular rhythm.  Pulmonary:     Effort: Pulmonary effort is normal. No tachypnea.     Breath sounds: Normal breath sounds.  Chest:     Chest wall: Tenderness present.  Abdominal:     Palpations: Abdomen is soft.     Tenderness: There is abdominal tenderness (Epigastric).  Musculoskeletal:        General: No swelling. Normal range of motion.     Cervical back: Neck supple.     Right lower leg: No edema.     Left lower leg: No  edema.  Skin:    General: Skin is warm and dry.     Capillary Refill: Capillary refill takes less than 2 seconds.     Coloration: Skin is not cyanotic or pale.     Comments: Abrasion to skin overlying right tricep  Neurological:     General: No focal deficit present.     Mental Status: He is alert and oriented to person, place, and time.  Psychiatric:        Mood and Affect: Mood normal.        Behavior: Behavior normal.     ED Results / Procedures / Treatments   Labs (all labs ordered are listed, but only abnormal results are displayed) Labs Reviewed  BASIC METABOLIC PANEL  CBC  TROPONIN I (HIGH SENSITIVITY)  TROPONIN I (HIGH SENSITIVITY)    EKG EKG Interpretation Date/Time:  Wednesday September 06 2022 11:12:13 EDT Ventricular Rate:  82 PR Interval:  134 QRS Duration:  80 QT  Interval:  340 QTC Calculation: 397 R Axis:   54  Text Interpretation: Normal sinus rhythm Confirmed by Gloris Manchester [phone]) on 09/06/2022 2:35:26 PM  Radiology DG Chest 2 View  Result Date: 09/06/2022 CLINICAL DATA:  CP EXAM: CHEST - 2 VIEW COMPARISON:  04/03/2018. FINDINGS: Bilateral lung fields are clear. Bilateral costophrenic angles are clear. Normal cardio-mediastinal silhouette. No acute osseous abnormalities. The soft tissues are within normal limits. IMPRESSION: No active cardiopulmonary disease. Electronically Signed   By: Jules Schick M.D.   On: 09/06/2022 12:25    Procedures Procedures    Medications Ordered in ED Medications  alum & mag hydroxide-simeth (MAALOX/MYLANTA) 200-200-20 MG/5ML suspension 30 mL (30 mLs Oral Given 09/06/22 1451)    And  lidocaine (XYLOCAINE) 2 % viscous mouth solution 15 mL (15 mLs Oral Not Given 09/06/22 1451)    ED Course/ Medical Decision Making/ A&P                             Medical Decision Making Amount and/or Complexity of Data Reviewed Labs: ordered. Radiology: ordered.  Risk OTC drugs. Prescription drug management.   Patient presents for chest pain.  Onset was 7 AM this morning.  Since that time, chest pain has diminished.  He describes area of pain in his lower chest/epigastrium.  This area is tender on exam.  Prior to being bedded in the ED, workup was initiated.  EKG and initial troponin are reassuring.  Patient states that he has had episodes similar to this in the past.  He does attribute this to anxiety.  He was offered GI cocktail for treatment of possible PUD.  He declined the lidocaine but did take the Maalox.  He did have improved symptoms after this.  He was encouraged to stay for repeat troponin but declined.  He was agreeable to initiate Protonix and to [facility].  At patient's request, he was discharged in stable condition.        Final Clinical Impression(s) / ED Diagnoses Final diagnoses:   Chest pain, unspecified type    Rx / DC Orders ED Discharge Orders          Ordered    pantoprazole (PROTONIX) 40 MG tablet  Daily        09/06/22 1514              Gloris Manchester, MD 09/06/22 1733

## 2022-09-06 NOTE — ED Triage Notes (Signed)
Pt c/o L sided CP non-radiating with SOB since earlier today.

## 2022-09-06 NOTE — ED Notes (Signed)
Patient refused blood draw, MD made aware. Will continue to monitor

## 2022-09-06 NOTE — Discharge Instructions (Addendum)
If the Maalox in the ED helped your symptoms, this is suggestive of gastritis and possible stomach ulcer.  A prescription for medication called pantoprazole was sent to your pharmacy.  This is a daily medication to lower the acid content in your stomach.  You can also take over-the-counter Maalox as needed.  You can further promote stomach wall healing with avoidance of alcohol, tobacco, and NSAID medications.  If you would like to see a gastroenterologist, there is a telephone number below that you can call to set up that follow-up appointment.  Return to the emergency department for any new or worsening symptoms of concern.
